# Patient Record
Sex: Male | Born: 1980 | Race: White | Hispanic: No | Marital: Single | Smoking: Current every day smoker
Health system: Southern US, Community
[De-identification: ages and names within clinical notes are randomized; demographics above are authoritative.]

## PROBLEM LIST (undated history)

## (undated) HISTORY — PX: APPENDECTOMY: SHX54

---

## 2019-11-17 ENCOUNTER — Other Ambulatory Visit: Payer: Self-pay

## 2019-11-17 ENCOUNTER — Emergency Department (HOSPITAL_BASED_OUTPATIENT_CLINIC_OR_DEPARTMENT_OTHER): Payer: 59

## 2019-11-17 ENCOUNTER — Encounter (HOSPITAL_BASED_OUTPATIENT_CLINIC_OR_DEPARTMENT_OTHER): Payer: Self-pay | Admitting: Emergency Medicine

## 2019-11-17 ENCOUNTER — Inpatient Hospital Stay (HOSPITAL_BASED_OUTPATIENT_CLINIC_OR_DEPARTMENT_OTHER)
Admission: EM | Admit: 2019-11-17 | Discharge: 2019-11-20 | DRG: 392 | Disposition: A | Discharge status: Home / Self Care | Payer: 59 | Attending: Internal Medicine | Admitting: Internal Medicine

## 2019-11-17 DIAGNOSIS — K760 Fatty (change of) liver, not elsewhere classified: Secondary | ICD-10-CM | POA: Diagnosis present

## 2019-11-17 DIAGNOSIS — K5720 Diverticulitis of large intestine with perforation and abscess without bleeding: Principal | ICD-10-CM | POA: Diagnosis present

## 2019-11-17 DIAGNOSIS — K4090 Unilateral inguinal hernia, without obstruction or gangrene, not specified as recurrent: Secondary | ICD-10-CM | POA: Diagnosis present

## 2019-11-17 DIAGNOSIS — K5700 Diverticulitis of small intestine with perforation and abscess without bleeding: Secondary | ICD-10-CM | POA: Diagnosis not present

## 2019-11-17 DIAGNOSIS — N281 Cyst of kidney, acquired: Secondary | ICD-10-CM | POA: Diagnosis present

## 2019-11-17 DIAGNOSIS — Z20822 Contact with and (suspected) exposure to covid-19: Secondary | ICD-10-CM | POA: Diagnosis present

## 2019-11-17 DIAGNOSIS — F17290 Nicotine dependence, other tobacco product, uncomplicated: Secondary | ICD-10-CM | POA: Diagnosis present

## 2019-11-17 DIAGNOSIS — Z9049 Acquired absence of other specified parts of digestive tract: Secondary | ICD-10-CM

## 2019-11-17 DIAGNOSIS — K567 Ileus, unspecified: Secondary | ICD-10-CM | POA: Diagnosis present

## 2019-11-17 DIAGNOSIS — K5780 Diverticulitis of intestine, part unspecified, with perforation and abscess without bleeding: Secondary | ICD-10-CM

## 2019-11-17 DIAGNOSIS — K862 Cyst of pancreas: Secondary | ICD-10-CM | POA: Diagnosis present

## 2019-11-17 LAB — CBC WITH DIFFERENTIAL/PLATELET
Abs Immature Granulocytes: 0.16 10*3/uL — ABNORMAL HIGH (ref 0.00–0.07)
Basophils Absolute: 0.1 10*3/uL (ref 0.0–0.1)
Basophils Relative: 1 %
Eosinophils Absolute: 0.2 10*3/uL (ref 0.0–0.5)
Eosinophils Relative: 1 %
HCT: 48.4 % (ref 39.0–52.0)
Hemoglobin: 16.7 g/dL (ref 13.0–17.0)
Immature Granulocytes: 1 %
Lymphocytes Relative: 10 %
Lymphs Abs: 2 10*3/uL (ref 0.7–4.0)
MCH: 31.7 pg (ref 26.0–34.0)
MCHC: 34.5 g/dL (ref 30.0–36.0)
MCV: 91.8 fL (ref 80.0–100.0)
Monocytes Absolute: 1.7 10*3/uL — ABNORMAL HIGH (ref 0.1–1.0)
Monocytes Relative: 9 %
Neutro Abs: 15.5 10*3/uL — ABNORMAL HIGH (ref 1.7–7.7)
Neutrophils Relative %: 78 %
Platelets: 278 10*3/uL (ref 150–400)
RBC: 5.27 MIL/uL (ref 4.22–5.81)
RDW: 12.4 % (ref 11.5–15.5)
WBC: 19.6 10*3/uL — ABNORMAL HIGH (ref 4.0–10.5)
nRBC: 0 % (ref 0.0–0.2)

## 2019-11-17 LAB — RESPIRATORY PANEL BY RT PCR (FLU A&B, COVID)
Influenza A by PCR: NEGATIVE
Influenza B by PCR: NEGATIVE
SARS Coronavirus 2 by RT PCR: NEGATIVE

## 2019-11-17 LAB — COMPREHENSIVE METABOLIC PANEL
ALT: 81 U/L — ABNORMAL HIGH (ref 0–44)
AST: 34 U/L (ref 15–41)
Albumin: 4.7 g/dL (ref 3.5–5.0)
Alkaline Phosphatase: 60 U/L (ref 38–126)
Anion gap: 12 (ref 5–15)
BUN: 14 mg/dL (ref 6–20)
CO2: 23 mmol/L (ref 22–32)
Calcium: 10 mg/dL (ref 8.9–10.3)
Chloride: 101 mmol/L (ref 98–111)
Creatinine, Ser: 1.21 mg/dL (ref 0.61–1.24)
GFR calc Af Amer: >60 mL/min (ref >60)
GFR calc non Af Amer: >60 mL/min (ref >60)
Glucose, Bld: 113 mg/dL — ABNORMAL HIGH (ref 70–99)
Potassium: 4.2 mmol/L (ref 3.5–5.1)
Sodium: 136 mmol/L (ref 135–145)
Total Bilirubin: 0.9 mg/dL (ref 0.3–1.2)
Total Protein: 8.1 g/dL (ref 6.5–8.1)

## 2019-11-17 LAB — LIPASE, BLOOD: Lipase: 30 U/L (ref 11–51)

## 2019-11-17 MED ORDER — IOHEXOL 300 MG/ML  SOLN
100.0000 mL | Freq: Once | INTRAMUSCULAR | Status: AC | PRN
  Administered 2019-11-17: 12:00:00 100 mL via INTRAVENOUS

## 2019-11-17 MED ORDER — IPRATROPIUM BROMIDE 0.02 % IN SOLN: 0.5000 mg | Freq: Four times a day (QID) | RESPIRATORY_TRACT | Status: DC | PRN

## 2019-11-17 MED ORDER — PIPERACILLIN-TAZOBACTAM 3.375 G IVPB 30 MIN
3.3750 g | Freq: Once | INTRAVENOUS | Status: AC
  Administered 2019-11-17: 3.375 g via INTRAVENOUS
  Filled 2019-11-17 (×2): qty 50

## 2019-11-17 MED ORDER — HYDROMORPHONE HCL 1 MG/ML IJ SOLN
1.0000 mg | Freq: Once | INTRAMUSCULAR | Status: AC
  Administered 2019-11-17: 1 mg via INTRAVENOUS
  Filled 2019-11-17: qty 1

## 2019-11-17 MED ORDER — SODIUM CHLORIDE 0.9 % IV SOLN
INTRAVENOUS | Status: DC | PRN
  Administered 2019-11-17: 250 mL via INTRAVENOUS

## 2019-11-17 MED ORDER — ACETAMINOPHEN 650 MG RE SUPP: 650.0000 mg | Freq: Four times a day (QID) | RECTAL | Status: DC | PRN

## 2019-11-17 MED ORDER — MORPHINE SULFATE (PF) 4 MG/ML IV SOLN
4.0000 mg | Freq: Once | INTRAVENOUS | Status: AC
  Administered 2019-11-17: 4 mg via INTRAVENOUS
  Filled 2019-11-17: qty 1

## 2019-11-17 MED ORDER — SODIUM CHLORIDE 0.9 % IV SOLN: INTRAVENOUS | Status: DC

## 2019-11-17 MED ORDER — ONDANSETRON HCL 4 MG/2ML IJ SOLN: 4.0000 mg | Freq: Four times a day (QID) | INTRAMUSCULAR | Status: DC | PRN

## 2019-11-17 MED ORDER — ACETAMINOPHEN 325 MG PO TABS: 650.0000 mg | ORAL_TABLET | Freq: Four times a day (QID) | ORAL | Status: DC | PRN

## 2019-11-17 MED ORDER — ONDANSETRON HCL 4 MG PO TABS: 4.0000 mg | ORAL_TABLET | Freq: Four times a day (QID) | ORAL | Status: DC | PRN

## 2019-11-17 MED ORDER — ONDANSETRON HCL 4 MG/2ML IJ SOLN
4.0000 mg | Freq: Once | INTRAMUSCULAR | Status: AC
  Administered 2019-11-17: 4 mg via INTRAVENOUS
  Filled 2019-11-17: qty 2

## 2019-11-17 MED ORDER — SENNOSIDES-DOCUSATE SODIUM 8.6-50 MG PO TABS: 1.0000 | ORAL_TABLET | Freq: Every evening | ORAL | Status: DC | PRN

## 2019-11-17 MED ORDER — HYDROCODONE-ACETAMINOPHEN 5-325 MG PO TABS
1.0000 | ORAL_TABLET | ORAL | Status: DC | PRN
  Administered 2019-11-17 – 2019-11-20 (×8): 2 via ORAL
  Filled 2019-11-17 (×8): qty 2

## 2019-11-17 MED ORDER — IPRATROPIUM BROMIDE 0.02 % IN SOLN: 0.5000 mg | Freq: Four times a day (QID) | RESPIRATORY_TRACT | Status: DC

## 2019-11-17 MED ORDER — PIPERACILLIN-TAZOBACTAM 3.375 G IVPB
3.3750 g | Freq: Three times a day (TID) | INTRAVENOUS | Status: DC
  Administered 2019-11-17 – 2019-11-20 (×9): 3.375 g via INTRAVENOUS
  Filled 2019-11-17 (×10): qty 50

## 2019-11-17 MED ORDER — HYDROMORPHONE HCL 1 MG/ML IJ SOLN
1.0000 mg | INTRAMUSCULAR | Status: DC | PRN
  Administered 2019-11-17 – 2019-11-18 (×6): 1 mg via INTRAVENOUS
  Filled 2019-11-17 (×6): qty 1

## 2019-11-17 MED ORDER — SODIUM CHLORIDE 0.9 % IV BOLUS
1000.0000 mL | Freq: Once | INTRAVENOUS | Status: AC
  Administered 2019-11-17: 1000 mL via INTRAVENOUS

## 2019-11-17 NOTE — Consult Note (Signed)
Re:   Justin Benjamin DOB:   1981-04-06 MRN:   [id number]  Chief Complaint Abdominal pain  ASSESEMENT AND PLAN: 1.  Sigmoid diverticulitis  Agree with plan of NPO, IVF, antibiotics, follow up PE and labs.  2.  Pancreatic cyst  Radiology suggest 1 year follow up  I gave him a copy of his CT scan report. 3.  Quit smoking about 4 months ago 4.  Possible inguinal hernias on CT scan  Chief Complaint  Patient presents with  . Abdominal Pain   PHYSICIAN REQUESTING CONSULTATION: Dr. Rod Can HISTORY OF PRESENT ILLNESS: Justin Benjamin is a 39 y.o. (DOB: [birth year])  white male whose primary care physician is Patient, No Pcp Per.   The patient went to [employer] last night for [place] food.  He developed pain after the meal.  He could not sleep.  His pain was worse this AM and he went to his parents house.  The took him to an Urgent Care who referred him to the [facility] ER.  He has no prior history of diverticular or colon disease.  He thinks that he had a colonoscopy over 10 years ago, but is unsure of this.  He had an appendectomy at age 40.  CT scan of the abdomen - 11/17/2019 - 1. Sigmoid diverticulitis. Several adjacent pockets of low attenuation within the wall of the sigmoid colon in the region of adjacent stranding is concerning for a developing abscess within the wall of the sigmoid colon. The maximum dimension is 3.5 cm as seen on coronal image 44. No extraluminal gas or pericolonic abscess.  2. A few loops of mildly dilated small bowel are seen in the pelvis. Focal ileus is favored over obstruction.   3. 8 mm cyst in the pancreatic tail. Recommend a follow-up CT scan in 1 year.  4. Hepatic steatosis.  5. Left renal cysts.  6. Fat containing inguinal hernia.  WBC - 19,600 - 11/17/2019   History reviewed. No pertinent past medical history.    Past Surgical History:  Procedure Laterality Date  . APPENDECTOMY        Current Facility-Administered Medications    Medication Dose Route Frequency Provider Last Rate Last Admin  . 0.9 %  sodium chloride infusion   Intravenous Continuous Rai, Ripudeep K, MD      . acetaminophen (TYLENOL) tablet 650 mg  650 mg Oral Q6H PRN Rai, Ripudeep K, MD       Or  . acetaminophen (TYLENOL) suppository 650 mg  650 mg Rectal Q6H PRN Rai, Ripudeep K, MD      . HYDROcodone-acetaminophen (NORCO/VICODIN) 5-325 MG per tablet 1-2 tablet  1-2 tablet Oral Q4H PRN Rai, Ripudeep K, MD      . HYDROmorphone (DILAUDID) injection 1 mg  1 mg Intravenous Q3H PRN Rai, Ripudeep K, MD   1 mg at 11/17/19 1739  . ipratropium (ATROVENT) nebulizer solution 0.5 mg  0.5 mg Nebulization Q6H Rai, Ripudeep K, MD      . ondansetron (ZOFRAN) tablet 4 mg  4 mg Oral Q6H PRN Rai, Ripudeep K, MD       Or  . ondansetron (ZOFRAN) injection 4 mg  4 mg Intravenous Q6H PRN Rai, Ripudeep K, MD      . piperacillin-tazobactam (ZOSYN) IVPB 3.375 g  3.375 g Intravenous Q8H Rai, Ripudeep K, MD      . senna-docusate (Senokot-S) tablet 1 tablet  1 tablet Oral QHS PRN Rai, Delene Ruffini, MD  No Known Allergies  REVIEW OF SYSTEMS: Skin:  No history of rash.  No history of abnormal moles. Infection:  No history of hepatitis or HIV.  No history of MRSA. Neurologic:  No history of stroke.  No history of seizure.  No history of headaches. Cardiac:  No history of hypertension. No history of heart disease.  No history of prior cardiac catheterization.  No history of seeing a cardiologist. Pulmonary:  Quit smoking 4 months ago.  Endocrine:  No diabetes. No thyroid disease. Gastrointestinal:  See HPI Urologic:  No history of kidney stones.  No history of bladder infections. Musculoskeletal:  No history of joint or back disease. Hematologic:  No bleeding disorder.  No history of anemia.  Not anticoagulated. Psycho-social:  The patient is oriented.   The patient has no obvious psychologic or social impairment to understanding our conversation and plan.  SOCIAL and  FAMILY HISTORY: Unmarried.  Has 91 yo daughter.  Works as an Clinical biochemist for [employer]  The patient has no family history of diverticular disease.  His father died of metastatic testicular cancer.  PHYSICAL EXAM: BP 130/88 (BP Location: Right Arm)   Pulse 90   Temp 98.3 F (36.8 C) (Oral)   Resp 18   Ht 5\' 11"  (1.803 m)   Wt 102 kg   SpO2 97%   BMI 31.36 kg/m   General: Heave WM who is alert and generally healthy appearing.  Skin:  Inspection and palpation - no mass or rash. Eyes:  Conjunctiva and lids unremarkable.            Pupils are equal Ears, Nose, Mouth, and Throat:  Ears and nose unremarkable            Lips and teeth are unremarable. Neck: Supple. No mass, trachea midline.  No thyroid mass. Lymph Nodes:  No supraclavicular, cervical, or inguinal nodes. Lungs: Normal respiratory effort.  Clear to auscultation and symmetric breath sounds. Heart:  Palpation of the heart is normal.            Auscultation: RRR. No murmur or rub.  Abdomen: Mildly distended.  Rare BS.  Tender LLQ with mild guarding. Rectal: Not done. Musculoskeletal:  Good muscle strength and ROM  in upper and lower extremities.  Neurologic:  Grossly intact to motor and sensory function. Psychiatric: Normal judgement and insight. Behavior is normal.            Oriented to time, person, place.   DATA REVIEWED, COUNSELING AND COORDINATION OF CARE: Epic notes reviewed. I have personally seen and evaluated the patient, evaluated laboratory and imaging results, formulated the assessment and plan and placed orders. This requires high medical decision making. Total time spent with patient and charting: 50 minutes This is a consultation.  Alphonsa Overall, MD,  [facility], [address]  [place], [place]    [place] Phone:  [phone] FAX:  [phone]

## 2019-11-17 NOTE — ED Notes (Addendum)
[facility] @ [phone] to have Hospitalist called [place] . Spoke to [employer]

## 2019-11-17 NOTE — ED Notes (Signed)
ED Provider at bedside. 

## 2019-11-17 NOTE — ED Triage Notes (Signed)
LLQ pain since 8pm. Denies N/V/D, urinary symptoms.

## 2019-11-17 NOTE — ED Notes (Signed)
Surgical consult called via [facility]link. Spoke with [place].

## 2019-11-17 NOTE — ED Provider Notes (Signed)
[facility] EMERGENCY DEPARTMENT Provider Note   CSN: [id number] Arrival date & time: 11/17/19  1050     History Chief Complaint  Patient presents with  . Abdominal Pain    Justin Benjamin is a 39 y.o. male with a history of tobacco abuse and prior appendectomy who presents to the ED with complaints of abdominal pain that began last night.  Patient states pain is located to the left lower quadrant, it is constant, progressively worsening, sharp in nature, currently a 9 out of 10 in severity.  No alleviating or aggravating factors.  Reports associated chills.  Last bowel movement was last night, had a lot of pain with this, passed a small amount of stool.  Denies fever, nausea, vomiting, melena, hematochezia, diarrhea, dysuria, or testicular pain/swelling.  HPI     History reviewed. No pertinent past medical history.  There are no problems to display for this patient.   Past Surgical History:  Procedure Laterality Date  . APPENDECTOMY         History reviewed. No pertinent family history.  Social History   Tobacco Use  . Smoking status: Current Every Day Smoker    Types: E-cigarettes  . Smokeless tobacco: Never Used  Substance Use Topics  . Alcohol use: Yes  . Drug use: Not on file    Home Medications Prior to Admission medications   Not on File    Allergies    Patient has no known allergies.  Review of Systems   Review of Systems  Constitutional: Positive for chills. Negative for fever.  Respiratory: Negative for shortness of breath.   Cardiovascular: Negative for chest pain and leg swelling.  Gastrointestinal: Positive for abdominal pain and constipation. Negative for anal bleeding, blood in stool, diarrhea, nausea and vomiting.  Genitourinary: Negative for dysuria, penile swelling and scrotal swelling.  All other systems reviewed and are negative.   Physical Exam Updated Vital Signs BP 135/84 (BP Location: Right Arm)   Pulse 92   Temp 97.8  F (36.6 C) (Oral)   Resp 18   Ht 5\' 11"  (1.803 m)   Wt 102.5 kg   SpO2 95%   BMI 31.52 kg/m   Physical Exam Vitals and nursing note reviewed.  Constitutional:      General: He is in acute distress (appears uncomfortable).     Appearance: He is well-developed. He is not toxic-appearing.  HENT:     Head: Normocephalic and atraumatic.  Eyes:     General:        Right eye: No discharge.        Left eye: No discharge.     Conjunctiva/sclera: Conjunctivae normal.  Cardiovascular:     Rate and Rhythm: Normal rate and regular rhythm.  Pulmonary:     Effort: Pulmonary effort is normal. No respiratory distress.     Breath sounds: Normal breath sounds. No wheezing, rhonchi or rales.  Abdominal:     General: There is no distension.     Palpations: Abdomen is soft.     Tenderness: There is abdominal tenderness in the left lower quadrant. There is guarding (mild).  Musculoskeletal:     Cervical back: Neck supple.  Skin:    General: Skin is warm and dry.     Findings: No rash.  Neurological:     Mental Status: He is alert.     Comments: Clear speech.   Psychiatric:        Behavior: Behavior normal.     ED Results /  Procedures / Treatments   Labs (all labs ordered are listed, but only abnormal results are displayed) Labs Reviewed  CBC WITH DIFFERENTIAL/PLATELET - Abnormal; Notable for the following components:      Result Value   WBC 19.6 (*)    Neutro Abs 15.5 (*)    Monocytes Absolute 1.7 (*)    Abs Immature Granulocytes 0.16 (*)    All other components within normal limits  COMPREHENSIVE METABOLIC PANEL - Abnormal; Notable for the following components:   Glucose, Bld 113 (*)    ALT 81 (*)    All other components within normal limits  LIPASE, BLOOD  URINALYSIS, ROUTINE W REFLEX MICROSCOPIC    EKG None  Radiology CT Abdomen Pelvis W Contrast  Result Date: 11/17/2019 CLINICAL DATA:  Abdominal pain. EXAM: CT ABDOMEN AND PELVIS WITH CONTRAST TECHNIQUE: Multidetector  CT imaging of the abdomen and pelvis was performed using the standard protocol following bolus administration of intravenous contrast. CONTRAST:  128mL OMNIPAQUE IOHEXOL 300 MG/ML  SOLN COMPARISON:  None. FINDINGS: Lower chest: Platelike atelectasis in the lingula. The lower chest and lower lungs are otherwise normal. Hepatobiliary: Hepatic steatosis. The liver, gallbladder, and portal vein are otherwise normal. Pancreas: There is a small probable cyst in the pancreatic tail measuring 8 mm as seen on coronal image 66 and sagittal image 27. The pancreas is otherwise normal. Spleen: Normal in size without focal abnormality. Adrenals/Urinary Tract: Adrenal glands are normal. There is a cyst in the left kidney best seen on coronal image 73. The kidneys, ureters, and bladder are otherwise normal. Stomach/Bowel: The stomach is normal. There are a few prominent/mildly dilated loops of small bowel in the pelvis such as on series 2, image 67. A representative small bowel loop measures just greater than 3 cm in caliber. No discrete transition point is identified. No wall thickening. The remainder of the small bowel is normal. Scattered colonic diverticuli are identified. There is significant stranding adjacent to the sigmoid colon. There are diverticuli in this region. There is low-attenuation in the sigmoid wall in this region as seen on coronal image 44. The low-attenuation appears to contain more than 1 pocket with a total maximum dimension of 3.5 cm. No definite extraluminal gas. No pericolonic abscess identified. The remainder of the colon is unremarkable. Previous appendectomy. Vascular/Lymphatic: No significant vascular findings are present. No enlarged abdominal or pelvic lymph nodes. Reproductive: Prostate is unremarkable. Other: Fat containing right inguinal hernia.  No free air. Musculoskeletal: No acute or significant osseous findings. IMPRESSION: 1. Sigmoid diverticulitis. Several adjacent pockets of low  attenuation within the wall of the sigmoid colon in the region of adjacent stranding is concerning for a developing abscess within the wall of the sigmoid colon. The maximum dimension is 3.5 cm as seen on coronal image 44. No extraluminal gas or pericolonic abscess. 2. A few loops of mildly dilated small bowel are seen in the pelvis. Focal ileus is favored over obstruction. Recommend attention on follow-up and clinical correlation. 3. 8 mm cyst in the pancreatic tail. Recommend a follow-up CT scan in 1 year. 4. Hepatic steatosis. 5. Left renal cysts. 6. Fat containing inguinal hernia. Electronically Signed   By: Dorise Bullion III M.D   On: 11/17/2019 12:40    Procedures Procedures (including critical care time)  Medications Ordered in ED Medications  sodium chloride 0.9 % bolus 1,000 mL (1,000 mLs Intravenous New Bag/Given 11/17/19 1134)  morphine 4 MG/ML injection 4 mg (4 mg Intravenous Given 11/17/19 1127)  ondansetron (ZOFRAN)  injection 4 mg (4 mg Intravenous Given 11/17/19 1128)  iohexol (OMNIPAQUE) 300 MG/ML solution 100 mL (100 mLs Intravenous Contrast Given 11/17/19 1146)    ED Course  I have reviewed the triage vital signs and the nursing notes.  Pertinent labs & imaging results that were available during my care of the patient were reviewed by me and considered in my medical decision making (see chart for details).  Justin Benjamin was evaluated in Emergency Department on 11/17/2019 for the symptoms described in the history of present illness. He/she was evaluated in the context of the global COVID-19 pandemic, which necessitated consideration that the patient might be at risk for infection with the SARS-CoV-2 virus that causes COVID-19. Institutional protocols and algorithms that pertain to the evaluation of patients at risk for COVID-19 are in a state of rapid change based on information released by regulatory bodies including the CDC and federal and state organizations. These policies and  algorithms were followed during the patient's care in the ED.    MDM Rules/Calculators/A&P                     Patient presents to the ED with complaints of abdominal pain. Nontoxic, appears uncomfortably, tachycardia/tachypnea resolved on my assessment. Focal LLQ abdominal tenderness with a degree of guarding noted. DDx: Diverticulitis, perforation, obstruction, intra-abdominal abscess, nephrolithiasis, pancreatitis, pyelonephritis, constipation.  Additional history obtained:  Previous records obtained and reviewed and nursing noted reviewed for additional history.   Lab Tests:  I Ordered, reviewed, and interpreted labs, which included: CBC: Leukocytosis at 19.6 with left shift.  No anemia. CMP: Mildly elevated ALT-no prior on record for comparison.  T bili WNL.  No significant electrolyte derangement. Lipase: WNL Urinalysis: Pending  Imaging Studies ordered:  I ordered imaging studies which included CT A/P, I independently visualized and interpreted imaging which showed sigmoid diverticulitis. Several adjacent pockets of low attenuation within the wall of the sigmoid colon in the region of adjacent stranding is concerning for a developing abscess within the wall of the sigmoid colon. The maximum dimension is 3.5 cm. No extraluminal gas or pericolonic abscess. A few loops of mildly dilated small bowel are seen in the pelvis. Focal ileus is favored over obstruction. Recommend attention on follow-up and clinical correlation. Additional findings as above.   Medicines ordered/Re-evaluations/Consultations  I ordered medication morphine, zofran, & fluids for symptomatic management on initial assessment.   13:00: RE-EVAL: Patient had minimal temporary relief with morphine, appears very uncomfortable on re-assessment, dilaudid ordered. Zosyn started for complicated diverticulitis. Will consult general surgery. Patient updated on results & plan of care at this time.    13:30: RE-EVAL: Pain 7/10 in  severity, additional analgesics ordered.   13:40: CONSULT: Discussed with general surgeon Dr. Ezzard Standing- plan for medical admission, will see in consultation.   13:52: CONSULT: Discussed case with hospitalist Dr. Hanley Ben, he states there are currently no beds at [facility] and [facility] is not accepting transfers, therefore he recommends seeking placement at alternative hospital facility, will place bed request in the interim.   14:26: CONSULT: Discussed case with hospitalist Dr. Laural Benes of [facility] with [facility]- currently no beds available, potential for ED to ED transfer, but he would not be able to accept the patient in this fashion.   14:32: CONSULT: Discussed with [facility] Transfer line- will page out to physician.   14:45: Patient has bed assignment at [facility], rediscussed with hospitalist service via messaging, will discontinue search for alternative placement at  this time.  Portions of this note were generated with Scientist, clinical (histocompatibility and immunogenetics). Dictation errors may occur despite best attempts at proofreading.  Findings and plan of care discussed with supervising physician Dr. Dalene Seltzer who is in agreement.    Final Clinical Impression(s) / ED Diagnoses Final diagnoses:  Diverticulitis of large intestine with abscess, unspecified bleeding status    Rx / DC Orders ED Discharge Orders    None       Cherly Anderson, PA-C 11/17/19 1657    Alvira Monday, MD 11/17/19 2316

## 2019-11-17 NOTE — Progress Notes (Signed)
Patient ID: Justin Benjamin, male   DOB: [birth year], 39 y.o.   MRN: [id number] I was called by med [facility] about this patient needing admission for acute sigmoid diverticulitis with abscess.  General surgery has been called who requested hospitalist admission.  As per [facility], currently no MedSurg beds at [facility] or [facility].  If MedSurg beds open up, we will accept the patient for admission to MedSurg at either of the hospitals.  In the meantime, I have advised Lelon Mast to look for other facilities for this patient as his condition might worsen and might need emergent IR guided drain placement or surgical intervention.

## 2019-11-17 NOTE — ED Notes (Signed)
Pt unable to provide urine sample at this time 

## 2019-11-17 NOTE — H&P (Signed)
History and Physical        Hospital Admission Note Date: 11/17/2019  Patient name: Justin Benjamin Medical record number: [id number] Date of birth: 1981/06/07 Age: 39 y.o. Gender: male  PCP: Patient, No Pcp Per    Patient coming from: home   I have reviewed all records in the [facility].    Chief Complaint:  Severe abdominal pain since yesterday evening  HPI: Patient is a 39 year old male with no significant past medical history presented from [place] with severe left lower quadrant abdominal pain that started at 8 PM on 4/24, day before the admission.  Patient reported that he went to [place] for dinner, came home around 7 PM.  About an hour later, patient started having significant left lower quadrant pain which progressively got worse, 10 out of 10 in intensity sharp and constant, no radiation.  Denied any nausea vomiting or diarrhea.  Felt chills but no fevers.  Had no prior history of diverticulitis or similar pain in the past.  At the time of my examination, abdominal pain 7-8/10 in intensity.  Last BM yesterday.   ED work-up/course:  In ED, temp 97.8, respiratory rate 18 pulse rate 93, BP 135/83, O2 sats at 97% on room air   CT abdomen showed sigmoid diverticulitis, concern for developing abscess maximum dimension 3.5 cm, in the wall of the sigmoid colon, ileus Sodium 136, potassium 4.2, BUN 14, creatinine 1.2 WBCs 19.6, hemoglobin 16.7 platelets 278  General surgery was consulted, recommended hospital service to admit.   Review of Systems: Positives marked in 'bold' Constitutional: Denies fever,diaphoresis, poor appetite and fatigue, + chills.  HEENT: Denies photophobia, eye pain, redness, hearing loss, ear pain, congestion, sore throat, rhinorrhea, sneezing, mouth sores, trouble swallowing, neck pain, neck stiffness and  tinnitus.   Respiratory: Denies SOB, DOE, cough, chest tightness,  and wheezing.   Cardiovascular: Denies chest pain, palpitations and leg swelling.  Gastrointestinal: See HPI Genitourinary: Denies dysuria, urgency, frequency, hematuria, flank pain and difficulty urinating.  Musculoskeletal: Denies myalgias, back pain, joint swelling, arthralgias and gait problem.  Skin: Denies pallor, rash and wound.  Neurological: Denies dizziness, seizures, syncope, weakness, light-headedness, numbness and headaches.  Hematological: Denies adenopathy. Easy bruising, personal or family bleeding history  Psychiatric/Behavioral: Denies suicidal ideation, mood changes, confusion, nervousness, sleep disturbance and agitation  Past Medical History: Patient reports no medical health issues  Past Surgical History:  Procedure Laterality Date  . APPENDECTOMY    At the age of 50  Medications: Prior to Admission medications   Medication Sig Start Date End Date Taking? Authorizing Provider  bisacodyl (DULCOLAX) 5 MG EC tablet Take 5 mg by mouth daily as needed for moderate constipation.   Yes [provider]    Allergies:  No Known Allergies  Social History: Quit smoking, about 5 months ago.  Drinks alcohol occasionally.  Denies any recreational drug use.    Family History: Patient reports that he is adopted and hence does not know his family medical history  Physical Exam: Blood pressure 130/88, pulse 90, temperature 98.3 F (36.8 C), temperature source Oral, resp. rate 18, height 5\' 11"  (1.803 m), weight 102 kg,  SpO2 97 %. General: Alert, awake, oriented x3, in no acute distress.  Uncomfortable due to abdominal pain Eyes: pink conjunctiva,anicteric sclera, pupils equal and reactive to light and accomodation, HEENT: normocephalic, atraumatic, oropharynx clear Neck: supple, no masses or lymphadenopathy, no goiter, no bruits, no JVD CVS: Regular rate and rhythm, without murmurs, rubs or  gallops. No lower extremity edema Resp : Clear to auscultation bilaterally, no wheezing, rales or rhonchi. GI : Soft, tenderness in the left lower quadrant, voluntary guarding, ND, NBS  Musculoskeletal: No clubbing or cyanosis, positive pedal pulses. No contracture. ROM intact  Neuro: Grossly intact, no focal neurological deficits, strength 5/5 upper and lower extremities bilaterally Psych: alert and oriented x 3, normal mood and affect Skin: no rashes or lesions, warm and dry   LABS on Admission: I have personally reviewed all the labs and imagings below    Basic Metabolic Panel: Recent Labs  Lab 11/17/19 1110  NA 136  K 4.2  CL 101  CO2 23  GLUCOSE 113*  BUN 14  CREATININE 1.21  CALCIUM 10.0   Liver Function Tests: Recent Labs  Lab 11/17/19 1110  AST 34  ALT 81*  ALKPHOS 60  BILITOT 0.9  PROT 8.1  ALBUMIN 4.7   Recent Labs  Lab 11/17/19 1110  LIPASE 30   No results for input(s): AMMONIA in the last 168 hours. CBC: Recent Labs  Lab 11/17/19 1110  WBC 19.6*  NEUTROABS 15.5*  HGB 16.7  HCT 48.4  MCV 91.8  PLT 278   Cardiac Enzymes: No results for input(s): CKTOTAL, CKMB, CKMBINDEX, TROPONINI in the last 168 hours. BNP: Invalid input(s): POCBNP CBG: No results for input(s): GLUCAP in the last 168 hours.  Radiological Exams on Admission:  CT Abdomen Pelvis W Contrast  Result Date: 11/17/2019 CLINICAL DATA:  Abdominal pain. EXAM: CT ABDOMEN AND PELVIS WITH CONTRAST TECHNIQUE: Multidetector CT imaging of the abdomen and pelvis was performed using the standard protocol following bolus administration of intravenous contrast. CONTRAST:  OMNIPAQUE IOHEXOL 300 MG/ML  SOLN COMPARISON:  None. FINDINGS: Lower chest: Platelike atelectasis in the lingula. The lower chest and lower lungs are otherwise normal. Hepatobiliary: Hepatic steatosis. The liver, gallbladder, and portal vein are otherwise normal. Pancreas: There is a small probable cyst in the pancreatic  tail measuring 8 mm as seen on coronal image 66 and sagittal image 27. The pancreas is otherwise normal. Spleen: Normal in size without focal abnormality. Adrenals/Urinary Tract: Adrenal glands are normal. There is a cyst in the left kidney best seen on coronal image 73. The kidneys, ureters, and bladder are otherwise normal. Stomach/Bowel: The stomach is normal. There are a few prominent/mildly dilated loops of small bowel in the pelvis such as on series 2, image 67. A representative small bowel loop measures just greater than 3 cm in caliber. No discrete transition point is identified. No wall thickening. The remainder of the small bowel is normal. Scattered colonic diverticuli are identified. There is significant stranding adjacent to the sigmoid colon. There are diverticuli in this region. There is low-attenuation in the sigmoid wall in this region as seen on coronal image 44. The low-attenuation appears to contain more than 1 pocket with a total maximum dimension of 3.5 cm. No definite extraluminal gas. No pericolonic abscess identified. The remainder of the colon is unremarkable. Previous appendectomy. Vascular/Lymphatic: No significant vascular findings are present. No enlarged abdominal or pelvic lymph nodes. Reproductive: Prostate is unremarkable. Other: Fat containing right inguinal hernia.  No free air. Musculoskeletal:  No acute or significant osseous findings. IMPRESSION: 1. Sigmoid diverticulitis. Several adjacent pockets of low attenuation within the wall of the sigmoid colon in the region of adjacent stranding is concerning for a developing abscess within the wall of the sigmoid colon. The maximum dimension is 3.5 cm as seen on coronal image 44. No extraluminal gas or pericolonic abscess. 2. A few loops of mildly dilated small bowel are seen in the pelvis. Focal ileus is favored over obstruction. Recommend attention on follow-up and clinical correlation. 3. 8 mm cyst in the pancreatic tail. Recommend  a follow-up CT scan in 1 year. 4. Hepatic steatosis. 5. Left renal cysts. 6. Fat containing inguinal hernia. Electronically Signed   By: Gerome Sam III M.D   On: 11/17/2019 12:40      EKG: No EKG available   Assessment/Plan Principal Problem: Acute abdominal pain, diverticulitis of large intestine with abscess -First episode.  Denies any prior history of diverticulitis.  Lipase normal -Placed on n.p.o. status, IV fluids, pain control, IV Zosyn -General surgery consulted, discussed with Dr. Ezzard Standing.  I also consulted interventional radiology if patient needs any drain for the fluid collection.  Leukocytosis - Likely due to #1, obtain blood cultures, continue IV Zosyn   DVT prophylaxis: SCDs  CODE STATUS: Full code, discussed with the patient  Consults called: General surgery, IR  Family Communication: Admission, patients condition and plan of care including tests being ordered have been discussed with the patient who indicates understanding and agree with the plan and Code Status  Admission status: Inpatient MedSurg  The medical decision making on this patient was of high complexity and the patient is at high risk for clinical deterioration, therefore this is a level 3 admission.  Severity of Illness:      The appropriate patient status for this patient is INPATIENT. Inpatient status is judged to be reasonable and necessary in order to provide the required intensity of service to ensure the patient's safety. The patient's presenting symptoms, physical exam findings, and initial radiographic and laboratory data in the context of their chronic comorbidities is felt to place them at high risk for further clinical deterioration. Furthermore, it is not anticipated that the patient will be medically stable for discharge from the hospital within 2 midnights of admission. The following factors support the patient status of inpatient.   " The patient's presenting symptoms include acute  left-sided abdominal pain, diverticulitis with abscess " The worrisome physical exam findings include worsening abdominal pain, chills " The initial radiographic and laboratory data are worrisome because of CT abdomen with sigmoid diverticulitis with developing abscess " The chronic co-morbidities include none   * I certify that at the point of admission it is my clinical judgment that the patient will require inpatient hospital care spanning beyond 2 midnights from the point of admission due to high intensity of service, high risk for further deterioration and high frequency of surveillance required.*    Time Spent on Admission: 60 minutes     Justin Benjamin M.D. [facility] 11/17/2019, 5:28 PM

## 2019-11-17 NOTE — Plan of Care (Signed)
Plan of care discussed with patient 

## 2019-11-17 NOTE — ED Notes (Signed)
Called [facility] spoke to [place] @ [phone] for pt transfer

## 2019-11-18 LAB — CBC
HCT: 45.6 % (ref 39.0–52.0)
Hemoglobin: 14.6 g/dL (ref 13.0–17.0)
MCH: 31.3 pg (ref 26.0–34.0)
MCHC: 32 g/dL (ref 30.0–36.0)
MCV: 97.6 fL (ref 80.0–100.0)
Platelets: 218 10*3/uL (ref 150–400)
RBC: 4.67 MIL/uL (ref 4.22–5.81)
RDW: 12.4 % (ref 11.5–15.5)
WBC: 16.7 10*3/uL — ABNORMAL HIGH (ref 4.0–10.5)
nRBC: 0 % (ref 0.0–0.2)

## 2019-11-18 LAB — BASIC METABOLIC PANEL
Anion gap: 8 (ref 5–15)
BUN: 14 mg/dL (ref 6–20)
CO2: 26 mmol/L (ref 22–32)
Calcium: 8.8 mg/dL — ABNORMAL LOW (ref 8.9–10.3)
Chloride: 103 mmol/L (ref 98–111)
Creatinine, Ser: 1.2 mg/dL (ref 0.61–1.24)
GFR calc Af Amer: >60 mL/min (ref >60)
GFR calc non Af Amer: >60 mL/min (ref >60)
Glucose, Bld: 110 mg/dL — ABNORMAL HIGH (ref 70–99)
Potassium: 4.3 mmol/L (ref 3.5–5.1)
Sodium: 137 mmol/L (ref 135–145)

## 2019-11-18 LAB — MRSA PCR SCREENING: MRSA by PCR: NEGATIVE

## 2019-11-18 LAB — HIV ANTIBODY (ROUTINE TESTING W REFLEX): HIV Screen 4th Generation wRfx: NONREACTIVE

## 2019-11-18 MED ORDER — HEPARIN SODIUM (PORCINE) 5000 UNIT/ML IJ SOLN
5000.0000 [IU] | Freq: Three times a day (TID) | INTRAMUSCULAR | Status: DC
  Administered 2019-11-18 – 2019-11-20 (×7): 5000 [IU] via SUBCUTANEOUS
  Filled 2019-11-18 (×8): qty 1

## 2019-11-18 NOTE — Progress Notes (Signed)
[facility]                                                                              Patient Demographics  Justin Benjamin, is a 39 y.o. male, DOB - Dec 09, 1980, BDZ:[id number]  Admit date - 11/17/2019   Admitting Physician Aline August, MD  Outpatient Primary MD for the patient is Patient, No Pcp Per  Outpatient specialists:   LOS - 1  days   Medical records reviewed and are as summarized below:    Chief Complaint  Patient presents with  . Abdominal Pain       Brief summary   Patient is a 39 year old male with no significant past medical history presented from [place] with severe left lower quadrant abdominal pain that started at 8 PM on 4/24, day before the admission.  Patient reported that he went to [place] for dinner, came home around 7 PM.  About an hour later, patient started having significant left lower quadrant pain which progressively got worse, 10 out of 10 in intensity sharp and constant, no radiation.  Denied any nausea vomiting or diarrhea.  Felt chills but no fevers.  Had no prior history of diverticulitis or similar pain in the past.   CT abdomen showed sigmoid diverticulitis, concern for developing abscess maximum dimension 3.5 cm  Assessment & Plan    Principal Problem:  Acute abdominal pain, diverticulitis of large intestine with developing abscess -First episode.  Denies any prior history of diverticulitis.  Lipase normal -Continue IV fluids, pain control, IV Zosyn -Per interventional radiology, abscesses in the wall of colon, no drainable collection -General surgery consulted -Placed on sips of clears, will place back on n.p.o. if worsening abdominal pain  Leukocytosis -WBC count improving, blood cultures negative so far, continue IV Zosyn  Code Status: Full code DVT Prophylaxis: Subcu heparin Family Communication: Discussed all imaging results, lab results, explained to the patient     Disposition Plan:     Status is: Inpatient  Remains inpatient appropriate because:IV treatments appropriate due to intensity of illness or inability to take PO   Dispo: The patient is from: Home              Anticipated d/c is to: Home              Anticipated d/c date is: 3 days              Patient currently is not medically stable to d/c.       Time Spent in minutes   35 minutes  Procedures:  None  Consultants:   General surgery IR  Antimicrobials:   Anti-infectives (From admission, onward)   Start     Dose/Rate Route Frequency Ordered Stop   11/17/19 1800  piperacillin-tazobactam (ZOSYN) IVPB 3.375 g     3.375 g 12.5 mL/hr over 240 Minutes Intravenous Every 8 hours 11/17/19 1310     11/17/19 1315  piperacillin-tazobactam (ZOSYN) IVPB 3.375 g     3.375 g 100 mL/hr over 30 Minutes Intravenous  Once 11/17/19 1310 11/17/19 1350          Medications  Scheduled Meds: Continuous Infusions: . sodium chloride 125 mL/hr at 11/18/19 1023  . sodium chloride 250 mL (11/17/19 1830)  . piperacillin-tazobactam (ZOSYN)  IV 3.375 g (11/18/19 0905)   PRN Meds:.sodium chloride, acetaminophen **OR** acetaminophen, HYDROcodone-acetaminophen, HYDROmorphone (DILAUDID) injection, ipratropium, ondansetron **OR** ondansetron (ZOFRAN) IV, senna-docusate      Subjective:   Kullen Tomasetti was seen and examined today.  Still has pain in the left lower quadrant 6/10, no nausea or vomiting, no fever chills. Patient denies dizziness, chest pain, shortness of breath, new weakness, numbess, tingling. No acute events overnight.    Objective:   Vitals:   11/17/19 1650 11/17/19 2115 11/18/19 0134 11/18/19 0554  BP:  131/88 124/83 110/85  Pulse:  100 87 92  Resp:  16 16 16   Temp:  98.3 F (36.8 C) 98.1 F (36.7 C) 97.7 F (36.5 C)  TempSrc:  Oral Oral Oral  SpO2:  94% 91% 92%  Weight: 102 kg     Height: 5\' 11"  (1.803 m)       Intake/Output Summary (Last 24 hours) at  11/18/2019 1150 Last data filed at 11/18/2019 0915 Gross per 24 hour  Intake 3221.08 ml  Output 0 ml  Net 3221.08 ml     Wt Readings from Last 3 Encounters:  11/17/19 102 kg     Exam  General: Alert and oriented x 3, NAD  Cardiovascular: S1 S2 auscultated, no murmurs, RRR  Respiratory: Clear to auscultation bilaterally, no wheezing, rales or rhonchi  Gastrointestinal: Soft, lower abdomen, left lower quadrant TTP nondistended, + bowel sounds  Ext: no pedal edema bilaterally  Neuro no new deficits  Musculoskeletal: No digital cyanosis, clubbing  Skin: No rashes  Psych: Normal affect and demeanor, alert and oriented x3    Data Reviewed:  I have personally reviewed following labs and imaging studies  Micro Results Recent Results (from the past 240 hour(s))  Respiratory Panel by RT PCR (Flu A&B, Covid) - Nasopharyngeal Swab     Status: None   Collection Time: 11/17/19  1:26 PM   Specimen: Nasopharyngeal Swab  Result Value Ref Range Status   SARS Coronavirus 2 by RT PCR NEGATIVE NEGATIVE Final    Comment: (NOTE) SARS-CoV-2 target nucleic acids are NOT DETECTED. The SARS-CoV-2 RNA is generally detectable in upper respiratoy specimens during the acute phase of infection. The lowest concentration of SARS-CoV-2 viral copies this assay can detect is 131 copies/mL. A negative result does not preclude SARS-Cov-2 infection and should not be used as the sole basis for treatment or other patient management decisions. A negative result may occur with  improper specimen collection/handling, submission of specimen other than nasopharyngeal swab, presence of viral mutation(s) within the areas targeted by this assay, and inadequate number of viral copies (<131 copies/mL). A negative result must be combined with clinical observations, patient history, and epidemiological information. The expected result is Negative. Fact Sheet for Patients:   11/19/19 Fact Sheet for Healthcare Providers:  11/19/19 This test is not yet ap proved or cleared by the https://www.moore.com/ FDA and  has been authorized for detection and/or diagnosis of SARS-CoV-2 by FDA under an Emergency Use Authorization (EUA). This EUA will remain  in effect (meaning this test can be used) for the duration of the COVID-19 declaration under Section 564(b)(1) of the Act, 21 U.S.C. section 360bbb-3(b)(1), unless the authorization is terminated or revoked sooner.    Influenza A by PCR NEGATIVE NEGATIVE Final   Influenza B by PCR NEGATIVE NEGATIVE Final  Comment: (NOTE) The Xpert Xpress SARS-CoV-2/FLU/RSV assay is intended as an aid in  the diagnosis of influenza from Nasopharyngeal swab specimens and  should not be used as a sole basis for treatment. Nasal washings and  aspirates are unacceptable for Xpert Xpress SARS-CoV-2/FLU/RSV  testing. Fact Sheet for Patients: https://www.moore.com/ Fact Sheet for Healthcare Providers: https://www.young.biz/ This test is not yet approved or cleared by the [place] FDA and  has been authorized for detection and/or diagnosis of SARS-CoV-2 by  FDA under an Emergency Use Authorization (EUA). This EUA will remain  in effect (meaning this test can be used) for the duration of the  Covid-19 declaration under Section 564(b)(1) of the Act, 21  U.S.C. section 360bbb-3(b)(1), unless the authorization is  terminated or revoked. Performed at [facility], [address] [place], [place] [address]   Culture, blood (Routine X 2) w Reflex to ID Panel     Status: None (Preliminary result)   Collection Time: 11/17/19  6:54 PM   Specimen: Right Antecubital; Blood  Result Value Ref Range Status   Specimen Description   Final    RIGHT ANTECUBITAL Performed at [facility], [address]  [place], [place] [address]    Special Requests   Final    BOTTLES DRAWN AEROBIC ONLY Blood Culture adequate volume Performed at [facility], [address], [place], [place] [address]    Culture   Final    NO GROWTH < 12 HOURS Performed at [facility], [address], [place], [place] [address]    Report Status PENDING  Incomplete  Culture, blood (Routine X 2) w Reflex to ID Panel     Status: None (Preliminary result)   Collection Time: 11/17/19  6:54 PM   Specimen: BLOOD LEFT HAND  Result Value Ref Range Status   Specimen Description   Final    BLOOD LEFT HAND Performed at [facility], [address], [place], [place] [address]    Special Requests   Final    BOTTLES DRAWN AEROBIC ONLY Blood Culture adequate volume Performed at [facility], [address] [place], [place] [address]    Culture   Final    NO GROWTH < 12 HOURS Performed at [facility], [address] [place], [place] [address]    Report Status PENDING  Incomplete  MRSA PCR Screening     Status: None   Collection Time: 11/18/19  5:00 AM   Specimen: Nasal Mucosa; Nasopharyngeal  Result Value Ref Range Status   MRSA by PCR NEGATIVE NEGATIVE Final    Comment: Performed at [facility], [address], [place], [place] [address]    Radiology Reports CT Abdomen Pelvis W Contrast  Result Date: 11/17/2019 CLINICAL DATA:  Abdominal pain. EXAM: CT ABDOMEN AND PELVIS WITH CONTRAST TECHNIQUE: Multidetector CT imaging of the abdomen and pelvis was performed using the standard protocol following bolus administration of intravenous contrast. CONTRAST:  OMNIPAQUE IOHEXOL 300 MG/ML  SOLN COMPARISON:  None. FINDINGS: Lower chest: Platelike atelectasis in the lingula. The lower chest and lower lungs are otherwise normal. Hepatobiliary: Hepatic steatosis. The liver, gallbladder, and portal vein are otherwise normal. Pancreas: There is a  small probable cyst in the pancreatic tail measuring 8 mm as seen on coronal image 66 and sagittal image 27. The pancreas is otherwise normal. Spleen: Normal in size without focal abnormality. Adrenals/Urinary Tract: Adrenal glands are normal. There is  a cyst in the left kidney best seen on coronal image 73. The kidneys, ureters, and bladder are otherwise normal. Stomach/Bowel: The stomach is normal. There are a few prominent/mildly dilated loops of small bowel in the pelvis such as on series 2, image 67. A representative small bowel loop measures just greater than 3 cm in caliber. No discrete transition point is identified. No wall thickening. The remainder of the small bowel is normal. Scattered colonic diverticuli are identified. There is significant stranding adjacent to the sigmoid colon. There are diverticuli in this region. There is low-attenuation in the sigmoid wall in this region as seen on coronal image 44. The low-attenuation appears to contain more than 1 pocket with a total maximum dimension of 3.5 cm. No definite extraluminal gas. No pericolonic abscess identified. The remainder of the colon is unremarkable. Previous appendectomy. Vascular/Lymphatic: No significant vascular findings are present. No enlarged abdominal or pelvic lymph nodes. Reproductive: Prostate is unremarkable. Other: Fat containing right inguinal hernia.  No free air. Musculoskeletal: No acute or significant osseous findings. IMPRESSION: 1. Sigmoid diverticulitis. Several adjacent pockets of low attenuation within the wall of the sigmoid colon in the region of adjacent stranding is concerning for a developing abscess within the wall of the sigmoid colon. The maximum dimension is 3.5 cm as seen on coronal image 44. No extraluminal gas or pericolonic abscess. 2. A few loops of mildly dilated small bowel are seen in the pelvis. Focal ileus is favored over obstruction. Recommend attention on follow-up and clinical correlation. 3. 8 mm  cyst in the pancreatic tail. Recommend a follow-up CT scan in 1 year. 4. Hepatic steatosis. 5. Left renal cysts. 6. Fat containing inguinal hernia. Electronically Signed   By: Gerome Samavid  Williams III M.D   On: 11/17/2019 12:40    Lab Data:  CBC: Recent Labs  Lab 11/17/19 1110 11/18/19 0430  WBC 19.6* 16.7*  NEUTROABS 15.5*  --   HGB 16.7 14.6  HCT 48.4 45.6  MCV 91.8 97.6  PLT 278 218   Basic Metabolic Panel: Recent Labs  Lab 11/17/19 1110 11/18/19 0430  NA 136 137  K 4.2 4.3  CL 101 103  CO2 23 26  GLUCOSE 113* 110*  BUN 14 14  CREATININE 1.21 1.20  CALCIUM 10.0 8.8*   GFR: Estimated Creatinine Clearance: 99.5 mL/min (by C-G formula based on SCr of 1.2 mg/dL). Liver Function Tests: Recent Labs  Lab 11/17/19 1110  AST 34  ALT 81*  ALKPHOS 60  BILITOT 0.9  PROT 8.1  ALBUMIN 4.7   Recent Labs  Lab 11/17/19 1110  LIPASE 30   No results for input(s): AMMONIA in the last 168 hours. Coagulation Profile: No results for input(s): INR, PROTIME in the last 168 hours. Cardiac Enzymes: No results for input(s): CKTOTAL, CKMB, CKMBINDEX, TROPONINI in the last 168 hours. BNP (last 3 results) No results for input(s): PROBNP in the last 8760 hours. HbA1C: No results for input(s): HGBA1C in the last 72 hours. CBG: No results for input(s): GLUCAP in the last 168 hours. Lipid Profile: No results for input(s): CHOL, HDL, LDLCALC, TRIG, CHOLHDL, LDLDIRECT in the last 72 hours. Thyroid Function Tests: No results for input(s): TSH, T4TOTAL, FREET4, T3FREE, THYROIDAB in the last 72 hours. Anemia Panel: No results for input(s): VITAMINB12, FOLATE, FERRITIN, TIBC, IRON, RETICCTPCT in the last 72 hours. Urine analysis: No results found for: COLORURINE, APPEARANCEUR, LABSPEC, PHURINE, GLUCOSEU, HGBUR, BILIRUBINUR, KETONESUR, PROTEINUR, UROBILINOGEN, NITRITE, LEUKOCYTESUR   Myalynn Lingle M.D. [facility] 11/18/2019, 11:50  AM   Call night coverage person covering after  7pm

## 2019-11-18 NOTE — Progress Notes (Signed)
Patient ID: Justin Benjamin, male   DOB: 29-Jun-1981, 39 y.o.   MRN: [id number]       Subjective: Patient still with a lot of pain.  Taking IV pain medication.  Says his pain is no better today than it was yesterday unless he is taking his pain meds.  Was started on CLD by medicine this morning.  ROS: See above, otherwise other systems negative  Objective: Vital signs in last 24 hours: Temp:  [97.7 F (36.5 C)-98.8 F (37.1 C)] 97.7 F (36.5 C) (04/26 0554) Pulse Rate:  [87-106] 92 (04/26 0554) Resp:  [16-24] 16 (04/26 0554) BP: (110-159)/(78-146) 110/85 (04/26 0554) SpO2:  [91 %-100 %] 92 % (04/26 0554) Weight:  [102 kg] 102 kg (04/25 1650) Last BM Date: 11/17/19  Intake/Output from previous day: 04/25 0701 - 04/26 0700 In: 2785.4 [P.O.:160; I.V.:1475.4; IV Piggyback:1150] Out: 0  Intake/Output this shift: Total I/O In: 435.7 [I.V.:433.8; IV Piggyback:1.9] Out: 0   PE: Heart: regular Lungs: CTAB Abd: soft, but very tender in LLQ and jumped and guarded with palpation to LLQ focally, +BS, ND  Lab Results:  Recent Labs    11/17/19 1110 11/18/19 0430  WBC 19.6* 16.7*  HGB 16.7 14.6  HCT 48.4 45.6  PLT 278 218   BMET Recent Labs    11/17/19 1110 11/18/19 0430  NA 136 137  K 4.2 4.3  CL 101 103  CO2 23 26  GLUCOSE 113* 110*  BUN 14 14  CREATININE 1.21 1.20  CALCIUM 10.0 8.8*   PT/INR No results for input(s): LABPROT, INR in the last 72 hours. CMP     Component Value Date/Time   NA 137 11/18/2019 0430   K 4.3 11/18/2019 0430   CL 103 11/18/2019 0430   CO2 26 11/18/2019 0430   GLUCOSE 110 (H) 11/18/2019 0430   BUN 14 11/18/2019 0430   CREATININE 1.20 11/18/2019 0430   CALCIUM 8.8 (L) 11/18/2019 0430   PROT 8.1 11/17/2019 1110   ALBUMIN 4.7 11/17/2019 1110   AST 34 11/17/2019 1110   ALT 81 (H) 11/17/2019 1110   ALKPHOS 60 11/17/2019 1110   BILITOT 0.9 11/17/2019 1110   GFRNONAA >60 11/18/2019 0430   GFRAA >60 11/18/2019 0430   Lipase       Component Value Date/Time   LIPASE 30 11/17/2019 1110       Studies/Results: CT Abdomen Pelvis W Contrast  Result Date: 11/17/2019 CLINICAL DATA:  Abdominal pain. EXAM: CT ABDOMEN AND PELVIS WITH CONTRAST TECHNIQUE: Multidetector CT imaging of the abdomen and pelvis was performed using the standard protocol following bolus administration of intravenous contrast. CONTRAST:  OMNIPAQUE IOHEXOL 300 MG/ML  SOLN COMPARISON:  None. FINDINGS: Lower chest: Platelike atelectasis in the lingula. The lower chest and lower lungs are otherwise normal. Hepatobiliary: Hepatic steatosis. The liver, gallbladder, and portal vein are otherwise normal. Pancreas: There is a small probable cyst in the pancreatic tail measuring 8 mm as seen on coronal image 66 and sagittal image 27. The pancreas is otherwise normal. Spleen: Normal in size without focal abnormality. Adrenals/Urinary Tract: Adrenal glands are normal. There is a cyst in the left kidney best seen on coronal image 73. The kidneys, ureters, and bladder are otherwise normal. Stomach/Bowel: The stomach is normal. There are a few prominent/mildly dilated loops of small bowel in the pelvis such as on series 2, image 67. A representative small bowel loop measures just greater than 3 cm in caliber. No discrete transition point is identified. No wall  thickening. The remainder of the small bowel is normal. Scattered colonic diverticuli are identified. There is significant stranding adjacent to the sigmoid colon. There are diverticuli in this region. There is low-attenuation in the sigmoid wall in this region as seen on coronal image 44. The low-attenuation appears to contain more than 1 pocket with a total maximum dimension of 3.5 cm. No definite extraluminal gas. No pericolonic abscess identified. The remainder of the colon is unremarkable. Previous appendectomy. Vascular/Lymphatic: No significant vascular findings are present. No enlarged abdominal or pelvic lymph  nodes. Reproductive: Prostate is unremarkable. Other: Fat containing right inguinal hernia.  No free air. Musculoskeletal: No acute or significant osseous findings. IMPRESSION: 1. Sigmoid diverticulitis. Several adjacent pockets of low attenuation within the wall of the sigmoid colon in the region of adjacent stranding is concerning for a developing abscess within the wall of the sigmoid colon. The maximum dimension is 3.5 cm as seen on coronal image 44. No extraluminal gas or pericolonic abscess. 2. A few loops of mildly dilated small bowel are seen in the pelvis. Focal ileus is favored over obstruction. Recommend attention on follow-up and clinical correlation. 3. 8 mm cyst in the pancreatic tail. Recommend a follow-up CT scan in 1 year. 4. Hepatic steatosis. 5. Left renal cysts. 6. Fat containing inguinal hernia. Electronically Signed   By: Dorise Bullion III M.D   On: 11/17/2019 12:40    Anti-infectives: Anti-infectives (From admission, onward)   Start     Dose/Rate Route Frequency Ordered Stop   11/17/19 1800  piperacillin-tazobactam (ZOSYN) IVPB 3.375 g     3.375 g 12.5 mL/hr over 240 Minutes Intravenous Every 8 hours 11/17/19 1310     11/17/19 1315  piperacillin-tazobactam (ZOSYN) IVPB 3.375 g     3.375 g 100 mL/hr over 30 Minutes Intravenous  Once 11/17/19 1310 11/17/19 1350       Assessment/Plan Diverticulitis with phlegmon -first episode -will try to get better with bowel rest and conservative measures -will need colonoscopy as outpatient -can continue CLD but if continues to have as much pain as he is having, may need to back off -cont zosyn   FEN - IVFs/CLD VTE - ok for chemical prophylaxis from our standpoint ID - zosyn 4/25 -->   LOS: 1 day    Henreitta Cea , [facility] Surgery 11/18/2019, 11:50 AM Please see Amion for pager number during day hours 7:00am-4:30pm or 7:00am -11:30am on weekends

## 2019-11-18 NOTE — Progress Notes (Signed)
Interventional Radiology Brief Note:  IR consulted for possible aspiration/drainage of diverticular abscess.  Currently, abscess is within the wall of the colon.  No drainable collection.  Appears surgery has already been consulted.   IR available if future need arises.   Loyce Dys, MS RD PA-C 8:41 AM

## 2019-11-19 LAB — CBC
HCT: 39.5 % (ref 39.0–52.0)
Hemoglobin: 12.7 g/dL — ABNORMAL LOW (ref 13.0–17.0)
MCH: 31.1 pg (ref 26.0–34.0)
MCHC: 32.2 g/dL (ref 30.0–36.0)
MCV: 96.8 fL (ref 80.0–100.0)
Platelets: 194 10*3/uL (ref 150–400)
RBC: 4.08 MIL/uL — ABNORMAL LOW (ref 4.22–5.81)
RDW: 11.9 % (ref 11.5–15.5)
WBC: 13.6 10*3/uL — ABNORMAL HIGH (ref 4.0–10.5)
nRBC: 0 % (ref 0.0–0.2)

## 2019-11-19 LAB — BASIC METABOLIC PANEL
Anion gap: 9 (ref 5–15)
BUN: 10 mg/dL (ref 6–20)
CO2: 22 mmol/L (ref 22–32)
Calcium: 8.4 mg/dL — ABNORMAL LOW (ref 8.9–10.3)
Chloride: 102 mmol/L (ref 98–111)
Creatinine, Ser: 0.86 mg/dL (ref 0.61–1.24)
GFR calc Af Amer: >60 mL/min (ref >60)
GFR calc non Af Amer: >60 mL/min (ref >60)
Glucose, Bld: 94 mg/dL (ref 70–99)
Potassium: 3.6 mmol/L (ref 3.5–5.1)
Sodium: 133 mmol/L — ABNORMAL LOW (ref 135–145)

## 2019-11-19 NOTE — Progress Notes (Addendum)
PROGRESS NOTE    Justin Benjamin  YPP:[id number] DOB: August 01, 1980 DOA: 11/17/2019 PCP: Patient, No Pcp Per    Brief Narrative:  Justin Benjamin is a4 year old male with no significant past medical history presented from [place] with severe left lower quadrant abdominal pain that started at 8 PM on 4/24, day before the admission. Patient reported that he went to [employer] for dinner, came home around 7 PM. About an hour later, patient started having significant left lower quadrant pain which progressively got worse, 10 out of 10 in intensity sharp and constant, no radiation. Denied any nausea vomiting or diarrhea. Felt chills but no fevers. Had no prior history of diverticulitis or similar pain in the past.   CT abdomen showed sigmoid diverticulitis, concern for developing abscess maximum dimension 3.5 cm   Assessment & Plan:   Principal Problem:   Diverticulitis of large intestine with abscess  Acute sigmoid diverticulitis with developing abscess Presenting with severe left lower quadrant pain.  CT abdomen/pelvis notable for sigmoid diverticulitis with developing abscess 3.5 cm, questionable ileus.  IR was consulted for possible drainage, unable to drain secondary to abscess is within the wall of the colon.  Patient reports this is his first episode of diverticulitis. --General surgery following, appreciate assistance --WBC 19.6>16.7>13.6 --Advancing diet to full liquid today --Continue NS at 125 mL's per hour --Continue empiric antibiotics with Zosyn --Follow CBC daily --We will need colonoscopy outpatient  DVT prophylaxis: Heparin Code Status: Full code Family Communication: Discussed with patient extensively at bedside  Disposition Plan:  Status is: Inpatient  Remains inpatient appropriate because:Ongoing active pain requiring inpatient pain management, Unsafe d/c plan, IV treatments appropriate due to intensity of illness or inability to take  PO and Inpatient level of care appropriate due to severity of illness   Dispo: The patient is from: Home              Anticipated d/c is to: Home              Anticipated d/c date is: 2 days              Patient currently is not medically stable to d/c.    Consultants:   General surgery  Procedures:   None  Antimicrobials:   Zosyn 4/25>>   Subjective: Patient seen and examined bedside, resting comfortably.  States pain is much better controlled today.  Continues on IV antibiotics.  Advancing diet per general surgery today.  No other complaints or concerns at this time.  Denies headache, no visual changes, no chest pain, no palpitations, no fever/chills/night sweats, no nausea/vomiting/diarrhea, no weakness, no cough/congestion, no fatigue.  No acute events overnight per nursing staff.  Objective: Vitals:   11/18/19 0554 11/18/19 1312 11/18/19 2206 11/19/19 0651  BP: 110/85 125/72 129/88 124/85  Pulse: 92 95 82 86  Resp: 16  18 18   Temp: 97.7 F (36.5 C) 98.6 F (37 C) 98.2 F (36.8 C) 98.4 F (36.9 C)  TempSrc: Oral  Oral Oral  SpO2: 92% 94% 95% 94%  Weight:      Height:        Intake/Output Summary (Last 24 hours) at 11/19/2019 1255 Last data filed at 11/19/2019 0600 Gross per 24 hour  Intake 3319.88 ml  Output 0 ml  Net 3319.88 ml   Filed Weights   11/17/19 1102 11/17/19 1650  Weight: 102.5 kg 102 kg    Examination:  General exam: Appears calm and comfortable  Respiratory system: Clear  to auscultation. Respiratory effort normal. Cardiovascular system: S1 & S2 heard, RRR. No JVD, murmurs, rubs, gallops or clicks. No pedal edema. Gastrointestinal system: Abdomen is nondistended, soft with mild left lower quadrant tenderness. No organomegaly or masses felt. Normal bowel sounds heard. Central nervous system: Alert and oriented. No focal neurological deficits. Extremities: Symmetric 5 x 5 power. Skin: No rashes, lesions or ulcers Psychiatry: Judgement and  insight appear normal. Mood & affect appropriate.     Data Reviewed: I have personally reviewed following labs and imaging studies  CBC: Recent Labs  Lab 11/17/19 1110 11/18/19 0430 11/19/19 0435  WBC 19.6* 16.7* 13.6*  NEUTROABS 15.5*  --   --   HGB 16.7 14.6 12.7*  HCT 48.4 45.6 39.5  MCV 91.8 97.6 96.8  PLT 278 218 194   Basic Metabolic Panel: Recent Labs  Lab 11/17/19 1110 11/18/19 0430 11/19/19 0435  NA 136 137 133*  K 4.2 4.3 3.6  CL 101 103 102  CO2 23 26 22   GLUCOSE 113* 110* 94  BUN 14 14 10   CREATININE 1.21 1.20 0.86  CALCIUM 10.0 8.8* 8.4*   GFR: Estimated Creatinine Clearance: 138.9 mL/min (by C-G formula based on SCr of 0.86 mg/dL). Liver Function Tests: Recent Labs  Lab 11/17/19 1110  AST 34  ALT 81*  ALKPHOS 60  BILITOT 0.9  PROT 8.1  ALBUMIN 4.7   Recent Labs  Lab 11/17/19 1110  LIPASE 30   No results for input(s): AMMONIA in the last 168 hours. Coagulation Profile: No results for input(s): INR, PROTIME in the last 168 hours. Cardiac Enzymes: No results for input(s): CKTOTAL, CKMB, CKMBINDEX, TROPONINI in the last 168 hours. BNP (last 3 results) No results for input(s): PROBNP in the last 8760 hours. HbA1C: No results for input(s): HGBA1C in the last 72 hours. CBG: No results for input(s): GLUCAP in the last 168 hours. Lipid Profile: No results for input(s): CHOL, HDL, LDLCALC, TRIG, CHOLHDL, LDLDIRECT in the last 72 hours. Thyroid Function Tests: No results for input(s): TSH, T4TOTAL, FREET4, T3FREE, THYROIDAB in the last 72 hours. Anemia Panel: No results for input(s): VITAMINB12, FOLATE, FERRITIN, TIBC, IRON, RETICCTPCT in the last 72 hours. Sepsis Labs: No results for input(s): PROCALCITON, LATICACIDVEN in the last 168 hours.  Recent Results (from the past 240 hour(s))  Respiratory Panel by RT PCR (Flu A&B, Covid) - Nasopharyngeal Swab     Status: None   Collection Time: 11/17/19  1:26 PM   Specimen: Nasopharyngeal Swab    Result Value Ref Range Status   SARS Coronavirus 2 by RT PCR NEGATIVE NEGATIVE Final    Comment: (NOTE) SARS-CoV-2 target nucleic acids are NOT DETECTED. The SARS-CoV-2 RNA is generally detectable in upper respiratoy specimens during the acute phase of infection. The lowest concentration of SARS-CoV-2 viral copies this assay can detect is 131 copies/mL. A negative result does not preclude SARS-Cov-2 infection and should not be used as the sole basis for treatment or other patient management decisions. A negative result may occur with  improper specimen collection/handling, submission of specimen other than nasopharyngeal swab, presence of viral mutation(s) within the areas targeted by this assay, and inadequate number of viral copies (<131 copies/mL). A negative result must be combined with clinical observations, patient history, and epidemiological information. The expected result is Negative. Fact Sheet for Patients:  11/19/19 Fact Sheet for Healthcare Providers:  11/19/19 This test is not yet ap proved or cleared by the https://www.moore.com/ FDA and  has been authorized for detection  and/or diagnosis of SARS-CoV-2 by FDA under an Emergency Use Authorization (EUA). This EUA will remain  in effect (meaning this test can be used) for the duration of the COVID-19 declaration under Section 564(b)(1) of the Act, 21 U.S.C. section 360bbb-3(b)(1), unless the authorization is terminated or revoked sooner.    Influenza A by PCR NEGATIVE NEGATIVE Final   Influenza B by PCR NEGATIVE NEGATIVE Final    Comment: (NOTE) The Xpert Xpress SARS-CoV-2/FLU/RSV assay is intended as an aid in  the diagnosis of influenza from Nasopharyngeal swab specimens and  should not be used as a sole basis for treatment. Nasal washings and  aspirates are unacceptable for Xpert Xpress SARS-CoV-2/FLU/RSV  testing. Fact Sheet for  Patients: https://www.moore.com/ Fact Sheet for Healthcare Providers: https://www.young.biz/ This test is not yet approved or cleared by the [place] FDA and  has been authorized for detection and/or diagnosis of SARS-CoV-2 by  FDA under an Emergency Use Authorization (EUA). This EUA will remain  in effect (meaning this test can be used) for the duration of the  Covid-19 declaration under Section 564(b)(1) of the Act, 21  U.S.C. section 360bbb-3(b)(1), unless the authorization is  terminated or revoked. Performed at [facility], [address] [place], [place] [address]   Culture, blood (Routine X 2) w Reflex to ID Panel     Status: None (Preliminary result)   Collection Time: 11/17/19  6:54 PM   Specimen: Right Antecubital; Blood  Result Value Ref Range Status   Specimen Description   Final    RIGHT ANTECUBITAL Performed at [facility], [address], [place], [place] [address]    Special Requests   Final    BOTTLES DRAWN AEROBIC ONLY Blood Culture adequate volume Performed at [facility] [place], [address] [place], [place] [address]    Culture   Final    NO GROWTH 2 DAYS Performed at [facility], [address], [place], [place] [address]    Report Status PENDING  Incomplete  Culture, blood (Routine X 2) w Reflex to ID Panel     Status: None (Preliminary result)   Collection Time: 11/17/19  6:54 PM   Specimen: BLOOD LEFT HAND  Result Value Ref Range Status   Specimen Description   Final    BLOOD LEFT HAND Performed at [facility], [address] [place], [place] [address]    Special Requests   Final    BOTTLES DRAWN AEROBIC ONLY Blood Culture adequate volume Performed at [facility], [address], [place], [place] [address]    Culture   Final    NO GROWTH 2 DAYS Performed at [facility], [address] [place],  [place] [address]    Report Status PENDING  Incomplete  MRSA PCR Screening     Status: None   Collection Time: 11/18/19  5:00 AM   Specimen: Nasal Mucosa; Nasopharyngeal  Result Value Ref Range Status   MRSA by PCR NEGATIVE NEGATIVE Final    Comment: Performed at [facility], [address] [place], [place] [address]         Radiology Studies: No results found.      Scheduled Meds: . heparin injection (subcutaneous)  5,000 Units Subcutaneous Q8H   Continuous Infusions: . sodium chloride 125 mL/hr at 11/19/19 0129  . sodium chloride 250 mL (11/17/19 1830)  . piperacillin-tazobactam (ZOSYN)  IV 3.375 g (11/19/19 0919)  LOS: 2 days    Time spent: 35 minutes spent on chart review, discussion with nursing staff, consultants, updating family and interview/physical exam; more than 50% of that time was spent in counseling and/or coordination of care.    Alvira Philips [place], DO [facility] Available via Epic secure chat 7am-7pm After these hours, please refer to coverage provider listed on [email or web address] 11/19/2019, 12:55 PM

## 2019-11-19 NOTE — Progress Notes (Signed)
Patient ID: Justin Benjamin, male   DOB: [birth year], 39 y.o.   MRN: [id number]       Subjective: Feels better today.  Stopped taking dilaudid but is still taking vicodin about every 4-5 hrs he states.  No issues with CLD, but doesn't really like it so not eating much  ROS: See above, otherwise other systems negative  Objective: Vital signs in last 24 hours: Temp:  [98.2 F (36.8 C)-98.6 F (37 C)] 98.4 F (36.9 C) (04/27 0651) Pulse Rate:  [82-95] 86 (04/27 0651) Resp:  [18] 18 (04/27 0651) BP: (124-129)/(72-88) 124/85 (04/27 0651) SpO2:  [94 %-95 %] 94 % (04/27 0651) Last BM Date: 11/17/19  Intake/Output from previous day: 04/26 0701 - 04/27 0700 In: 3755.6 [P.O.:600; I.V.:3005.6; IV Piggyback:150] Out: 0  Intake/Output this shift: No intake/output data recorded.  PE: Heart: regular Lungs: CTAB Abd: soft, still tender in LLQ, but somewhat better than yesterday, +BS, ND  Lab Results:  Recent Labs    11/18/19 0430 11/19/19 0435  WBC 16.7* 13.6*  HGB 14.6 12.7*  HCT 45.6 39.5  PLT 218 194   BMET Recent Labs    11/18/19 0430 11/19/19 0435  NA 137 133*  K 4.3 3.6  CL 103 102  CO2 26 22  GLUCOSE 110* 94  BUN 14 10  CREATININE 1.20 0.86  CALCIUM 8.8* 8.4*   PT/INR No results for input(s): LABPROT, INR in the last 72 hours. CMP     Component Value Date/Time   NA 133 (L) 11/19/2019 0435   K 3.6 11/19/2019 0435   CL 102 11/19/2019 0435   CO2 22 11/19/2019 0435   GLUCOSE 94 11/19/2019 0435   BUN 10 11/19/2019 0435   CREATININE 0.86 11/19/2019 0435   CALCIUM 8.4 (L) 11/19/2019 0435   PROT 8.1 11/17/2019 1110   ALBUMIN 4.7 11/17/2019 1110   AST 34 11/17/2019 1110   ALT 81 (H) 11/17/2019 1110   ALKPHOS 60 11/17/2019 1110   BILITOT 0.9 11/17/2019 1110   GFRNONAA >60 11/19/2019 0435   GFRAA >60 11/19/2019 0435   Lipase     Component Value Date/Time   LIPASE 30 11/17/2019 1110       Studies/Results: CT Abdomen Pelvis W Contrast  Result Date:  11/17/2019 CLINICAL DATA:  Abdominal pain. EXAM: CT ABDOMEN AND PELVIS WITH CONTRAST TECHNIQUE: Multidetector CT imaging of the abdomen and pelvis was performed using the standard protocol following bolus administration of intravenous contrast. CONTRAST:  148mL OMNIPAQUE IOHEXOL 300 MG/ML  SOLN COMPARISON:  None. FINDINGS: Lower chest: Platelike atelectasis in the lingula. The lower chest and lower lungs are otherwise normal. Hepatobiliary: Hepatic steatosis. The liver, gallbladder, and portal vein are otherwise normal. Pancreas: There is a small probable cyst in the pancreatic tail measuring 8 mm as seen on coronal image 66 and sagittal image 27. The pancreas is otherwise normal. Spleen: Normal in size without focal abnormality. Adrenals/Urinary Tract: Adrenal glands are normal. There is a cyst in the left kidney best seen on coronal image 73. The kidneys, ureters, and bladder are otherwise normal. Stomach/Bowel: The stomach is normal. There are a few prominent/mildly dilated loops of small bowel in the pelvis such as on series 2, image 67. A representative small bowel loop measures just greater than 3 cm in caliber. No discrete transition point is identified. No wall thickening. The remainder of the small bowel is normal. Scattered colonic diverticuli are identified. There is significant stranding adjacent to the sigmoid colon. There are diverticuli in this  region. There is low-attenuation in the sigmoid wall in this region as seen on coronal image 44. The low-attenuation appears to contain more than 1 pocket with a total maximum dimension of 3.5 cm. No definite extraluminal gas. No pericolonic abscess identified. The remainder of the colon is unremarkable. Previous appendectomy. Vascular/Lymphatic: No significant vascular findings are present. No enlarged abdominal or pelvic lymph nodes. Reproductive: Prostate is unremarkable. Other: Fat containing right inguinal hernia.  No free air. Musculoskeletal: No acute or  significant osseous findings. IMPRESSION: 1. Sigmoid diverticulitis. Several adjacent pockets of low attenuation within the wall of the sigmoid colon in the region of adjacent stranding is concerning for a developing abscess within the wall of the sigmoid colon. The maximum dimension is 3.5 cm as seen on coronal image 44. No extraluminal gas or pericolonic abscess. 2. A few loops of mildly dilated small bowel are seen in the pelvis. Focal ileus is favored over obstruction. Recommend attention on follow-up and clinical correlation. 3. 8 mm cyst in the pancreatic tail. Recommend a follow-up CT scan in 1 year. 4. Hepatic steatosis. 5. Left renal cysts. 6. Fat containing inguinal hernia. Electronically Signed   By: Gerome Sam III M.D   On: 11/17/2019 12:40    Anti-infectives: Anti-infectives (From admission, onward)   Start     Dose/Rate Route Frequency Ordered Stop   11/17/19 1800  piperacillin-tazobactam (ZOSYN) IVPB 3.375 g     3.375 g 12.5 mL/hr over 240 Minutes Intravenous Every 8 hours 11/17/19 1310     11/17/19 1315  piperacillin-tazobactam (ZOSYN) IVPB 3.375 g     3.375 g 100 mL/hr over 30 Minutes Intravenous  Once 11/17/19 1310 11/17/19 1350       Assessment/Plan Diverticulitis with phlegmon -first episode -will try to get better with bowel rest and conservative measures -will need colonoscopy as outpatient -try FLD diet today -follow WBC, down to 13K today -cont zosyn   FEN - IVFs/FLD VTE - heparin ID - zosyn 4/25 -->   LOS: 2 days    Letha Cape , [facility] Surgery 11/19/2019, 10:06 AM Please see Amion for pager number during day hours 7:00am-4:30pm or 7:00am -11:30am on weekends

## 2019-11-20 LAB — BASIC METABOLIC PANEL
Anion gap: 7 (ref 5–15)
BUN: 8 mg/dL (ref 6–20)
CO2: 25 mmol/L (ref 22–32)
Calcium: 8.6 mg/dL — ABNORMAL LOW (ref 8.9–10.3)
Chloride: 105 mmol/L (ref 98–111)
Creatinine, Ser: 0.96 mg/dL (ref 0.61–1.24)
GFR calc Af Amer: >60 mL/min (ref >60)
GFR calc non Af Amer: >60 mL/min (ref >60)
Glucose, Bld: 98 mg/dL (ref 70–99)
Potassium: 4.2 mmol/L (ref 3.5–5.1)
Sodium: 137 mmol/L (ref 135–145)

## 2019-11-20 LAB — CBC
HCT: 38.4 % — ABNORMAL LOW (ref 39.0–52.0)
Hemoglobin: 12.9 g/dL — ABNORMAL LOW (ref 13.0–17.0)
MCH: 32.3 pg (ref 26.0–34.0)
MCHC: 33.6 g/dL (ref 30.0–36.0)
MCV: 96.2 fL (ref 80.0–100.0)
Platelets: 201 10*3/uL (ref 150–400)
RBC: 3.99 MIL/uL — ABNORMAL LOW (ref 4.22–5.81)
RDW: 11.9 % (ref 11.5–15.5)
WBC: 8 10*3/uL (ref 4.0–10.5)
nRBC: 0 % (ref 0.0–0.2)

## 2019-11-20 LAB — MAGNESIUM: Magnesium: 2.3 mg/dL (ref 1.7–2.4)

## 2019-11-20 MED ORDER — HYDROCODONE-ACETAMINOPHEN 5-325 MG PO TABS: 1.0000 | ORAL_TABLET | Freq: Four times a day (QID) | ORAL | 0 refills | Status: AC | PRN

## 2019-11-20 MED ORDER — AMOXICILLIN-POT CLAVULANATE 875-125 MG PO TABS: 1.0000 | ORAL_TABLET | Freq: Two times a day (BID) | ORAL | 0 refills | Status: AC

## 2019-11-20 NOTE — Progress Notes (Signed)
PROGRESS NOTE    Justin Benjamin  PNT:[id number] DOB: November 22, 1980 DOA: 11/17/2019 PCP: Patient, No Pcp Per    Brief Narrative:  Justin Benjamin is a68 year old male with no significant past medical history presented from [facility] with severe left lower quadrant abdominal pain that started at 8 PM on 4/24, day before the admission. Patient reported that he went to [employer] for dinner, came home around 7 PM. About an hour later, patient started having significant left lower quadrant pain which progressively got worse, 10 out of 10 in intensity sharp and constant, no radiation. Denied any nausea vomiting or diarrhea. Felt chills but no fevers. Had no prior history of diverticulitis or similar pain in the past.   CT abdomen showed sigmoid diverticulitis, concern for developing abscess maximum dimension 3.5 cm   Assessment & Plan:   Principal Problem:   Diverticulitis of large intestine with abscess  Acute sigmoid diverticulitis with developing abscess Presenting with severe left lower quadrant pain.  CT abdomen/pelvis notable for sigmoid diverticulitis with developing abscess 3.5 cm, questionable ileus.  IR was consulted for possible drainage, unable to drain secondary to abscess is within the wall of the colon.  Patient reports this is his first episode of diverticulitis. --General surgery following, appreciate assistance --WBC 19.6>16.7>13.6>8.0 --Advancing diet to soft today by general surgery --Continue empiric antibiotics with Zosyn; likely to change to Augmentin to complete antibiotic course on discharge --Follow CBC daily --Will need colonoscopy outpatient --Possible discharge today versus tomorrow depending on toleration of advance diet  DVT prophylaxis: Heparin Code Status: Full code Family Communication: Discussed with patient extensively at bedside  Disposition Plan:  Status is: Inpatient  Remains inpatient appropriate because:Ongoing active  pain requiring inpatient pain management, Unsafe d/c plan, IV treatments appropriate due to intensity of illness or inability to take PO and Inpatient level of care appropriate due to severity of illness   Dispo: The patient is from: Home              Anticipated d/c is to: Home              Anticipated d/c date is: 1 day              Patient currently is not medically stable to d/c.    Consultants:   General surgery  Procedures:   None  Antimicrobials:   Zosyn 4/25>>   Subjective: Patient seen and examined bedside, resting comfortably.  States pain is much better controlled today.  Continues on IV antibiotics.  White blood cell count now normalized.  Advancing diet to soft per general surgery today; with possible of discharge today versus tomorrow depending on toleration.  No other complaints or concerns at this time.  Denies headache, no visual changes, no chest pain, no palpitations, no fever/chills/night sweats, no nausea/vomiting/diarrhea, no weakness, no cough/congestion, no fatigue.  No acute events overnight per nursing staff.  Objective: Vitals:   11/19/19 2121 11/20/19 0532 11/20/19 1022 11/20/19 1338  BP: (!) 144/93 (!) 133/91 134/88 (!) 131/91  Pulse: 80 66 77 79  Resp: 19 20 16 16   Temp: 98.9 F (37.2 C) 97.8 F (36.6 C) 98 F (36.7 C) 98 F (36.7 C)  TempSrc: Oral Oral Oral Oral  SpO2: 94% 95% 96% 96%  Weight:      Height:        Intake/Output Summary (Last 24 hours) at 11/20/2019 1409 Last data filed at 11/20/2019 1400 Gross per 24 hour  Intake 4028.14 ml  Output 0 ml  Net 4028.14 ml   Filed Weights   11/17/19 1102 11/17/19 1650  Weight: 102.5 kg 102 kg    Examination:  General exam: Appears calm and comfortable  Respiratory system: Clear to auscultation. Respiratory effort normal. Cardiovascular system: S1 & S2 heard, RRR. No JVD, murmurs, rubs, gallops or clicks. No pedal edema. Gastrointestinal system: Abdomen is nondistended, soft with mild  left lower quadrant tenderness. No organomegaly or masses felt. Normal bowel sounds heard. Central nervous system: Alert and oriented. No focal neurological deficits. Extremities: Symmetric 5 x 5 power. Skin: No rashes, lesions or ulcers Psychiatry: Judgement and insight appear normal. Mood & affect appropriate.     Data Reviewed: I have personally reviewed following labs and imaging studies  CBC: Recent Labs  Lab 11/17/19 1110 11/18/19 0430 11/19/19 0435 11/20/19 0413  WBC 19.6* 16.7* 13.6* 8.0  NEUTROABS 15.5*  --   --   --   HGB 16.7 14.6 12.7* 12.9*  HCT 48.4 45.6 39.5 38.4*  MCV 91.8 97.6 96.8 96.2  PLT 278 218 194 201   Basic Metabolic Panel: Recent Labs  Lab 11/17/19 1110 11/18/19 0430 11/19/19 0435 11/20/19 0413  NA 136 137 133* 137  K 4.2 4.3 3.6 4.2  CL 101 103 102 105  CO2 23 26 22 25   GLUCOSE 113* 110* 94 98  BUN 14 14 10 8   CREATININE 1.21 1.20 0.86 0.96  CALCIUM 10.0 8.8* 8.4* 8.6*  MG  --   --   --  2.3   GFR: Estimated Creatinine Clearance: 124.4 mL/min (by C-G formula based on SCr of 0.96 mg/dL). Liver Function Tests: Recent Labs  Lab 11/17/19 1110  AST 34  ALT 81*  ALKPHOS 60  BILITOT 0.9  PROT 8.1  ALBUMIN 4.7   Recent Labs  Lab 11/17/19 1110  LIPASE 30   No results for input(s): AMMONIA in the last 168 hours. Coagulation Profile: No results for input(s): INR, PROTIME in the last 168 hours. Cardiac Enzymes: No results for input(s): CKTOTAL, CKMB, CKMBINDEX, TROPONINI in the last 168 hours. BNP (last 3 results) No results for input(s): PROBNP in the last 8760 hours. HbA1C: No results for input(s): HGBA1C in the last 72 hours. CBG: No results for input(s): GLUCAP in the last 168 hours. Lipid Profile: No results for input(s): CHOL, HDL, LDLCALC, TRIG, CHOLHDL, LDLDIRECT in the last 72 hours. Thyroid Function Tests: No results for input(s): TSH, T4TOTAL, FREET4, T3FREE, THYROIDAB in the last 72 hours. Anemia Panel: No results  for input(s): VITAMINB12, FOLATE, FERRITIN, TIBC, IRON, RETICCTPCT in the last 72 hours. Sepsis Labs: No results for input(s): PROCALCITON, LATICACIDVEN in the last 168 hours.  Recent Results (from the past 240 hour(s))  Respiratory Panel by RT PCR (Flu A&B, Covid) - Nasopharyngeal Swab     Status: None   Collection Time: 11/17/19  1:26 PM   Specimen: Nasopharyngeal Swab  Result Value Ref Range Status   SARS Coronavirus 2 by RT PCR NEGATIVE NEGATIVE Final    Comment: (NOTE) SARS-CoV-2 target nucleic acids are NOT DETECTED. The SARS-CoV-2 RNA is generally detectable in upper respiratoy specimens during the acute phase of infection. The lowest concentration of SARS-CoV-2 viral copies this assay can detect is 131 copies/mL. A negative result does not preclude SARS-Cov-2 infection and should not be used as the sole basis for treatment or other patient management decisions. A negative result may occur with  improper specimen collection/handling, submission of specimen other than nasopharyngeal swab, presence of viral  mutation(s) within the areas targeted by this assay, and inadequate number of viral copies (<131 copies/mL). A negative result must be combined with clinical observations, patient history, and epidemiological information. The expected result is Negative. Fact Sheet for Patients:  https://www.moore.com/ Fact Sheet for Healthcare Providers:  https://www.young.biz/ This test is not yet ap proved or cleared by the [place] FDA and  has been authorized for detection and/or diagnosis of SARS-CoV-2 by FDA under an Emergency Use Authorization (EUA). This EUA will remain  in effect (meaning this test can be used) for the duration of the COVID-19 declaration under Section 564(b)(1) of the Act, 21 U.S.C. section 360bbb-3(b)(1), unless the authorization is terminated or revoked sooner.    Influenza A by PCR NEGATIVE NEGATIVE Final    Influenza B by PCR NEGATIVE NEGATIVE Final    Comment: (NOTE) The Xpert Xpress SARS-CoV-2/FLU/RSV assay is intended as an aid in  the diagnosis of influenza from Nasopharyngeal swab specimens and  should not be used as a sole basis for treatment. Nasal washings and  aspirates are unacceptable for Xpert Xpress SARS-CoV-2/FLU/RSV  testing. Fact Sheet for Patients: https://www.moore.com/ Fact Sheet for Healthcare Providers: https://www.young.biz/ This test is not yet approved or cleared by the [place] FDA and  has been authorized for detection and/or diagnosis of SARS-CoV-2 by  FDA under an Emergency Use Authorization (EUA). This EUA will remain  in effect (meaning this test can be used) for the duration of the  Covid-19 declaration under Section 564(b)(1) of the Act, 21  U.S.C. section 360bbb-3(b)(1), unless the authorization is  terminated or revoked. Performed at [facility], [address] [place], [place] [address]   Culture, blood (Routine X 2) w Reflex to ID Panel     Status: None (Preliminary result)   Collection Time: 11/17/19  6:54 PM   Specimen: Right Antecubital; Blood  Result Value Ref Range Status   Specimen Description   Final    RIGHT ANTECUBITAL Performed at [facility], [address], [place], [place] [address]    Special Requests   Final    BOTTLES DRAWN AEROBIC ONLY Blood Culture adequate volume Performed at [facility], [address], [place], [place] [address]    Culture   Final    NO GROWTH 3 DAYS Performed at [facility], [address], [place], [place] [address]    Report Status PENDING  Incomplete  Culture, blood (Routine X 2) w Reflex to ID Panel     Status: None (Preliminary result)   Collection Time: 11/17/19  6:54 PM   Specimen: BLOOD LEFT HAND  Result Value Ref Range Status   Specimen Description   Final    BLOOD LEFT HAND Performed at  [facility], [address] [place], [place] [address]    Special Requests   Final    BOTTLES DRAWN AEROBIC ONLY Blood Culture adequate volume Performed at [facility], [address], [place], [place] [address]    Culture   Final    NO GROWTH 3 DAYS Performed at [facility], [address], [place], [place] [address]    Report Status PENDING  Incomplete  MRSA PCR Screening     Status: None   Collection Time: 11/18/19  5:00 AM   Specimen: Nasal Mucosa; Nasopharyngeal  Result Value Ref Range Status   MRSA by PCR NEGATIVE NEGATIVE Final    Comment: Performed at [facility],  [address], [place], [place] [address]         Radiology Studies: No results found.      Scheduled Meds: . heparin injection (subcutaneous)  5,000 Units Subcutaneous Q8H   Continuous Infusions: . sodium chloride 250 mL (11/17/19 1830)  . piperacillin-tazobactam (ZOSYN)  IV 3.375 g (11/20/19 1002)     LOS: 3 days    Time spent: 35 minutes spent on chart review, discussion with nursing staff, consultants, updating family and interview/physical exam; more than 50% of that time was spent in counseling and/or coordination of care.    Alvira Philips [place], DO [facility] Available via Epic secure chat 7am-7pm After these hours, please refer to coverage provider listed on [email or web address] 11/20/2019, 2:09 PM

## 2019-11-20 NOTE — Progress Notes (Signed)
Patient ID: Justin Benjamin, male   DOB: [birth year], 39 y.o.   MRN: [id number]       Subjective: Feeling better today.  No vicodin since overnight.  Tolerating full liquids well with no other issues.  Had a BM and feels better from that as well.  ROS: See above, otherwise other systems negative  Objective: Vital signs in last 24 hours: Temp:  [97.8 F (36.6 C)-98.9 F (37.2 C)] 97.8 F (36.6 C) (04/28 0532) Pulse Rate:  [66-83] 66 (04/28 0532) Resp:  [17-20] 20 (04/28 0532) BP: (130-144)/(90-93) 133/91 (04/28 0532) SpO2:  [94 %-97 %] 95 % (04/28 0532) Last BM Date: 11/20/19  Intake/Output from previous day: 04/27 0701 - 04/28 0700 In: 3259.4 [P.O.:340; I.V.:2769.3; IV Piggyback:150] Out: 0  Intake/Output this shift: No intake/output data recorded.  PE: Abd: soft, much less tender in LLQ, +BS, ND  Lab Results:  Recent Labs    11/19/19 0435 11/20/19 0413  WBC 13.6* 8.0  HGB 12.7* 12.9*  HCT 39.5 38.4*  PLT 194 201   BMET Recent Labs    11/19/19 0435 11/20/19 0413  NA 133* 137  K 3.6 4.2  CL 102 105  CO2 22 25  GLUCOSE 94 98  BUN 10 8  CREATININE 0.86 0.96  CALCIUM 8.4* 8.6*   PT/INR No results for input(s): LABPROT, INR in the last 72 hours. CMP     Component Value Date/Time   NA 137 11/20/2019 0413   K 4.2 11/20/2019 0413   CL 105 11/20/2019 0413   CO2 25 11/20/2019 0413   GLUCOSE 98 11/20/2019 0413   BUN 8 11/20/2019 0413   CREATININE 0.96 11/20/2019 0413   CALCIUM 8.6 (L) 11/20/2019 0413   PROT 8.1 11/17/2019 1110   ALBUMIN 4.7 11/17/2019 1110   AST 34 11/17/2019 1110   ALT 81 (H) 11/17/2019 1110   ALKPHOS 60 11/17/2019 1110   BILITOT 0.9 11/17/2019 1110   GFRNONAA >60 11/20/2019 0413   GFRAA >60 11/20/2019 0413   Lipase     Component Value Date/Time   LIPASE 30 11/17/2019 1110       Studies/Results: No results found.  Anti-infectives: Anti-infectives (From admission, onward)   Start     Dose/Rate Route Frequency Ordered Stop   11/17/19 1800  piperacillin-tazobactam (ZOSYN) IVPB 3.375 g     3.375 g 12.5 mL/hr over 240 Minutes Intravenous Every 8 hours 11/17/19 1310     11/17/19 1315  piperacillin-tazobactam (ZOSYN) IVPB 3.375 g     3.375 g 100 mL/hr over 30 Minutes Intravenous  Once 11/17/19 1310 11/17/19 1350       Assessment/Plan Diverticulitis with phlegmon -first episode -will try to get better with bowel rest and conservative measures -will need colonoscopy as outpatient -soft diet -diet education by dietitian -WBC normal today -can likely convert zosyn to oral augmentin -if tolerates diet and oral augmentin can likely DC later today vs tomorrow.  FEN -soft diet VTE -heparin ID -zosyn 4/25 -->   LOS: 3 days    Letha Cape , [facility] 11/20/2019, 9:30 AM Please see Amion for pager number during day hours 7:00am-4:30pm or 7:00am -11:30am on weekends

## 2019-11-20 NOTE — Discharge Instructions (Signed)

## 2019-11-20 NOTE — Discharge Summary (Signed)
Physician Discharge Summary  Ramez Arrona ZOX:[id number] DOB: [birth year] DOA: 11/17/2019  PCP: Patient, No Pcp Per  Admit date: 11/17/2019 Discharge date: 11/20/2019  Admitted From: Home Disposition: Home    Recommendations for Outpatient Follow-up:  1. Follow up with PCP in 1-2 weeks 2. Follow-up with general surgery as needed 3. Continue antibiotics with Augmentin 875-125 mg p.o. twice daily to complete a 14-day course 4. Will need colonoscopy outpatient once recovered from acute diverticulitis  Home Health: No Equipment/Devices: None  Discharge Condition: Stable CODE STATUS: Full code Diet recommendation: Soft diet  History of present illness:  Justin Benjamin is a37 year old male with no significant past medical history presented from med [facility] with severe left lower quadrant abdominal pain that started at 8 PM on 4/24, day before the admission. Patient reported that he went to [employer] for dinner, came home around 7 PM. About an hour later, patient started having significant left lower quadrant pain which progressively got worse, 10 out of 10 in intensity sharp and constant, no radiation. Denied any nausea vomiting or diarrhea. Felt chills but no fevers. Had no prior history of diverticulitis or similar pain in the past.  CT abdomen showed sigmoid diverticulitis, concern for developing abscess maximum dimension 3.5 cm  Hospital course:  Acute sigmoid diverticulitis with developing abscess Presenting with severe left lower quadrant pain.  CT abdomen/pelvis notable for sigmoid diverticulitis with developing abscess 3.5 cm, questionable ileus.  IR was consulted for possible drainage, unable to drain secondary to abscess is within the wall of the colon.  Patient reports this is his first episode of diverticulitis.  General surgery was consulted and followed during hospital course.  Recommended conservative management with bowel rest and IV  antibiotics.  Patient was started on Zosyn with improvement of his white blood cell count from 19.6-8.0 on discharge.  His diet was slowly advanced with good toleration.  We will continue antibiotics outpatient with Augmentin 875-125 mg p.o. twice daily to complete a 14-day course.  Will need to follow-up with PCP and will need colonoscopy outpatient following resolution of his acute infectious process.   Discharge Diagnoses:  Principal Problem:   Diverticulitis of large intestine with abscess    Discharge Instructions  Discharge Instructions    Call MD for:  difficulty breathing, headache or visual disturbances   Complete by: As directed    Call MD for:  extreme fatigue   Complete by: As directed    Call MD for:  persistant dizziness or light-headedness   Complete by: As directed    Call MD for:  persistant nausea and vomiting   Complete by: As directed    Call MD for:  severe uncontrolled pain   Complete by: As directed    Call MD for:  temperature >100.4   Complete by: As directed    Diet - low sodium heart healthy   Complete by: As directed    Increase activity slowly   Complete by: As directed      Allergies as of 11/20/2019   No Known Allergies     Medication List    TAKE these medications   amoxicillin-clavulanate 875-125 MG tablet Commonly known as: Augmentin Take 1 tablet by mouth 2 (two) times daily for 12 days.   bisacodyl 5 MG EC tablet Commonly known as: DULCOLAX Take 5 mg by mouth daily as needed for moderate constipation.   HYDROcodone-acetaminophen 5-325 MG tablet Commonly known as: NORCO/VICODIN Take 1 tablet by mouth every 6 (six) hours  as needed for up to 7 days for moderate pain.       No Known Allergies  Consultations:  General surgery - Dr. Gerrit Friends   Procedures/Studies: CT Abdomen Pelvis W Contrast  Result Date: 11/17/2019 CLINICAL DATA:  Abdominal pain. EXAM: CT ABDOMEN AND PELVIS WITH CONTRAST TECHNIQUE: Multidetector CT imaging of  the abdomen and pelvis was performed using the standard protocol following bolus administration of intravenous contrast. CONTRAST:  OMNIPAQUE IOHEXOL 300 MG/ML  SOLN COMPARISON:  None. FINDINGS: Lower chest: Platelike atelectasis in the lingula. The lower chest and lower lungs are otherwise normal. Hepatobiliary: Hepatic steatosis. The liver, gallbladder, and portal vein are otherwise normal. Pancreas: There is a small probable cyst in the pancreatic tail measuring 8 mm as seen on coronal image 66 and sagittal image 27. The pancreas is otherwise normal. Spleen: Normal in size without focal abnormality. Adrenals/Urinary Tract: Adrenal glands are normal. There is a cyst in the left kidney best seen on coronal image 73. The kidneys, ureters, and bladder are otherwise normal. Stomach/Bowel: The stomach is normal. There are a few prominent/mildly dilated loops of small bowel in the pelvis such as on series 2, image 67. A representative small bowel loop measures just greater than 3 cm in caliber. No discrete transition point is identified. No wall thickening. The remainder of the small bowel is normal. Scattered colonic diverticuli are identified. There is significant stranding adjacent to the sigmoid colon. There are diverticuli in this region. There is low-attenuation in the sigmoid wall in this region as seen on coronal image 44. The low-attenuation appears to contain more than 1 pocket with a total maximum dimension of 3.5 cm. No definite extraluminal gas. No pericolonic abscess identified. The remainder of the colon is unremarkable. Previous appendectomy. Vascular/Lymphatic: No significant vascular findings are present. No enlarged abdominal or pelvic lymph nodes. Reproductive: Prostate is unremarkable. Other: Fat containing right inguinal hernia.  No free air. Musculoskeletal: No acute or significant osseous findings. IMPRESSION: 1. Sigmoid diverticulitis. Several adjacent pockets of low attenuation within the  wall of the sigmoid colon in the region of adjacent stranding is concerning for a developing abscess within the wall of the sigmoid colon. The maximum dimension is 3.5 cm as seen on coronal image 44. No extraluminal gas or pericolonic abscess. 2. A few loops of mildly dilated small bowel are seen in the pelvis. Focal ileus is favored over obstruction. Recommend attention on follow-up and clinical correlation. 3. 8 mm cyst in the pancreatic tail. Recommend a follow-up CT scan in 1 year. 4. Hepatic steatosis. 5. Left renal cysts. 6. Fat containing inguinal hernia. Electronically Signed   By: Gerome Sam III M.D   On: 11/17/2019 12:40      Subjective: Patient seen and examined bedside, resting comfortably.  Abdominal pain much improved, with leukocytosis now resolved.  Tolerating advance diet.  Okay for discharge home by general surgery with continued antibiotics with Augmentin.  No other complaints or concerns at this time.  Denies headache, no fever/chills/night sweats, no nausea without vomiting/diarrhea, no chest pain, palpitations, no shortness of breath, no weakness, no fatigue.  No acute events overnight per nursing staff.  Discharge Exam: Vitals:   11/20/19 1022 11/20/19 1338  BP: 134/88 (!) 131/91  Pulse: 77 79  Resp: 16 16  Temp: 98 F (36.7 C) 98 F (36.7 C)  SpO2: 96% 96%   Vitals:   11/19/19 2121 11/20/19 0532 11/20/19 1022 11/20/19 1338  BP: (!) 144/93 (!) 133/91 134/88 (!) 131/91  Pulse: 80 66 77 79  Resp: 19 20 16 16   Temp: 98.9 F (37.2 C) 97.8 F (36.6 C) 98 F (36.7 C) 98 F (36.7 C)  TempSrc: Oral Oral Oral Oral  SpO2: 94% 95% 96% 96%  Weight:      Height:        General: Pt is alert, awake, not in acute distress Cardiovascular: RRR, S1/S2 +, no rubs, no gallops Respiratory: CTA bilaterally, no wheezing, no rhonchi Abdominal: Soft, NT, ND, bowel sounds + Extremities: no edema, no cyanosis    The results of significant diagnostics from this  hospitalization (including imaging, microbiology, ancillary and laboratory) are listed below for reference.     Microbiology: Recent Results (from the past 240 hour(s))  Respiratory Panel by RT PCR (Flu A&B, Covid) - Nasopharyngeal Swab     Status: None   Collection Time: 11/17/19  1:26 PM   Specimen: Nasopharyngeal Swab  Result Value Ref Range Status   SARS Coronavirus 2 by RT PCR NEGATIVE NEGATIVE Final    Comment: (NOTE) SARS-CoV-2 target nucleic acids are NOT DETECTED. The SARS-CoV-2 RNA is generally detectable in upper respiratoy specimens during the acute phase of infection. The lowest concentration of SARS-CoV-2 viral copies this assay can detect is 131 copies/mL. A negative result does not preclude SARS-Cov-2 infection and should not be used as the sole basis for treatment or other patient management decisions. A negative result may occur with  improper specimen collection/handling, submission of specimen other than nasopharyngeal swab, presence of viral mutation(s) within the areas targeted by this assay, and inadequate number of viral copies (<131 copies/mL). A negative result must be combined with clinical observations, patient history, and epidemiological information. The expected result is Negative. Fact Sheet for Patients:  PinkCheek.be Fact Sheet for Healthcare Providers:  GravelBags.it This test is not yet ap proved or cleared by the [place] FDA and  has been authorized for detection and/or diagnosis of SARS-CoV-2 by FDA under an Emergency Use Authorization (EUA). This EUA will remain  in effect (meaning this test can be used) for the duration of the COVID-19 declaration under Section 564(b)(1) of the Act, 21 U.S.C. section 360bbb-3(b)(1), unless the authorization is terminated or revoked sooner.    Influenza A by PCR NEGATIVE NEGATIVE Final   Influenza B by PCR NEGATIVE NEGATIVE Final    Comment:  (NOTE) The Xpert Xpress SARS-CoV-2/FLU/RSV assay is intended as an aid in  the diagnosis of influenza from Nasopharyngeal swab specimens and  should not be used as a sole basis for treatment. Nasal washings and  aspirates are unacceptable for Xpert Xpress SARS-CoV-2/FLU/RSV  testing. Fact Sheet for Patients: PinkCheek.be Fact Sheet for Healthcare Providers: GravelBags.it This test is not yet approved or cleared by the [place] FDA and  has been authorized for detection and/or diagnosis of SARS-CoV-2 by  FDA under an Emergency Use Authorization (EUA). This EUA will remain  in effect (meaning this test can be used) for the duration of the  Covid-19 declaration under Section 564(b)(1) of the Act, 21  U.S.C. section 360bbb-3(b)(1), unless the authorization is  terminated or revoked. Performed at [facility], [address] [place], [place] [address]   Culture, blood (Routine X 2) w Reflex to ID Panel     Status: None (Preliminary result)   Collection Time: 11/17/19  6:54 PM   Specimen: Right Antecubital; Blood  Result Value Ref Range Status   Specimen Description   Final    RIGHT ANTECUBITAL  Performed at [facility], [address] [place], [place] [address]    Special Requests   Final    BOTTLES DRAWN AEROBIC ONLY Blood Culture adequate volume Performed at [facility], [address] [place], [place] [address]    Culture   Final    NO GROWTH 3 DAYS Performed at [facility], [address] [place], [place] [address]    Report Status PENDING  Incomplete  Culture, blood (Routine X 2) w Reflex to ID Panel     Status: None (Preliminary result)   Collection Time: 11/17/19  6:54 PM   Specimen: BLOOD LEFT HAND  Result Value Ref Range Status   Specimen Description   Final    BLOOD LEFT HAND Performed at [facility], [address]  [place], [place] [address]    Special Requests   Final    BOTTLES DRAWN AEROBIC ONLY Blood Culture adequate volume Performed at [facility], [address] [place], [place] [address]    Culture   Final    NO GROWTH 3 DAYS Performed at [facility], [address] [place], [place] [address]    Report Status PENDING  Incomplete  MRSA PCR Screening     Status: None   Collection Time: 11/18/19  5:00 AM   Specimen: Nasal Mucosa; Nasopharyngeal  Result Value Ref Range Status   MRSA by PCR NEGATIVE NEGATIVE Final    Comment: Performed at [facility], [address] [place], [place] [address]     Labs: BNP (last 3 results) No results for input(s): BNP in the last 8760 hours. Basic Metabolic Panel: Recent Labs  Lab 11/17/19 1110 11/18/19 0430 11/19/19 0435 11/20/19 0413  NA 136 137 133* 137  K 4.2 4.3 3.6 4.2  CL 101 103 102 105  CO2 23 26 22 25   GLUCOSE 113* 110* 94 98  BUN 14 14 10 8   CREATININE 1.21 1.20 0.86 0.96  CALCIUM 10.0 8.8* 8.4* 8.6*  MG  --   --   --  2.3   Liver Function Tests: Recent Labs  Lab 11/17/19 1110  AST 34  ALT 81*  ALKPHOS 60  BILITOT 0.9  PROT 8.1  ALBUMIN 4.7   Recent Labs  Lab 11/17/19 1110  LIPASE 30   No results for input(s): AMMONIA in the last 168 hours. CBC: Recent Labs  Lab 11/17/19 1110 11/18/19 0430 11/19/19 0435 11/20/19 0413  WBC 19.6* 16.7* 13.6* 8.0  NEUTROABS 15.5*  --   --   --   HGB 16.7 14.6 12.7* 12.9*  HCT 48.4 45.6 39.5 38.4*  MCV 91.8 97.6 96.8 96.2  PLT 278 218 194 201   Cardiac Enzymes: No results for input(s): CKTOTAL, CKMB, CKMBINDEX, TROPONINI in the last 168 hours. BNP: Invalid input(s): POCBNP CBG: No results for input(s): GLUCAP in the last 168 hours. D-Dimer No results for input(s): DDIMER in the last 72 hours. Hgb A1c No results for input(s): HGBA1C in the last 72 hours. Lipid Profile No results for input(s): CHOL, HDL, LDLCALC, TRIG, CHOLHDL,  LDLDIRECT in the last 72 hours. Thyroid function studies No results for input(s): TSH, T4TOTAL, T3FREE, THYROIDAB in the last 72 hours.  Invalid input(s): FREET3 Anemia work up No results for input(s): VITAMINB12, FOLATE, FERRITIN, TIBC, IRON, RETICCTPCT in the last 72 hours. Urinalysis No results found for: COLORURINE, APPEARANCEUR, LABSPEC, PHURINE, GLUCOSEU, HGBUR, BILIRUBINUR, KETONESUR, PROTEINUR, UROBILINOGEN, NITRITE, LEUKOCYTESUR Sepsis Labs Invalid  input(s): PROCALCITONIN,  WBC,  LACTICIDVEN Microbiology Recent Results (from the past 240 hour(s))  Respiratory Panel by RT PCR (Flu A&B, Covid) - Nasopharyngeal Swab     Status: None   Collection Time: 11/17/19  1:26 PM   Specimen: Nasopharyngeal Swab  Result Value Ref Range Status   SARS Coronavirus 2 by RT PCR NEGATIVE NEGATIVE Final    Comment: (NOTE) SARS-CoV-2 target nucleic acids are NOT DETECTED. The SARS-CoV-2 RNA is generally detectable in upper respiratoy specimens during the acute phase of infection. The lowest concentration of SARS-CoV-2 viral copies this assay can detect is 131 copies/mL. A negative result does not preclude SARS-Cov-2 infection and should not be used as the sole basis for treatment or other patient management decisions. A negative result may occur with  improper specimen collection/handling, submission of specimen other than nasopharyngeal swab, presence of viral mutation(s) within the areas targeted by this assay, and inadequate number of viral copies (<131 copies/mL). A negative result must be combined with clinical observations, patient history, and epidemiological information. The expected result is Negative. Fact Sheet for Patients:  https://www.moore.com/ Fact Sheet for Healthcare Providers:  https://www.young.biz/ This test is not yet ap proved or cleared by the [place] FDA and  has been authorized for detection and/or diagnosis of SARS-CoV-2  by FDA under an Emergency Use Authorization (EUA). This EUA will remain  in effect (meaning this test can be used) for the duration of the COVID-19 declaration under Section 564(b)(1) of the Act, 21 U.S.C. section 360bbb-3(b)(1), unless the authorization is terminated or revoked sooner.    Influenza A by PCR NEGATIVE NEGATIVE Final   Influenza B by PCR NEGATIVE NEGATIVE Final    Comment: (NOTE) The Xpert Xpress SARS-CoV-2/FLU/RSV assay is intended as an aid in  the diagnosis of influenza from Nasopharyngeal swab specimens and  should not be used as a sole basis for treatment. Nasal washings and  aspirates are unacceptable for Xpert Xpress SARS-CoV-2/FLU/RSV  testing. Fact Sheet for Patients: https://www.moore.com/ Fact Sheet for Healthcare Providers: https://www.young.biz/ This test is not yet approved or cleared by the [place] FDA and  has been authorized for detection and/or diagnosis of SARS-CoV-2 by  FDA under an Emergency Use Authorization (EUA). This EUA will remain  in effect (meaning this test can be used) for the duration of the  Covid-19 declaration under Section 564(b)(1) of the Act, 21  U.S.C. section 360bbb-3(b)(1), unless the authorization is  terminated or revoked. Performed at [facility], [address] [place], [place] [address]   Culture, blood (Routine X 2) w Reflex to ID Panel     Status: None (Preliminary result)   Collection Time: 11/17/19  6:54 PM   Specimen: Right Antecubital; Blood  Result Value Ref Range Status   Specimen Description   Final    RIGHT ANTECUBITAL Performed at [facility], [address], [place], [place] [address]    Special Requests   Final    BOTTLES DRAWN AEROBIC ONLY Blood Culture adequate volume Performed at [facility], [address], [place], [place] [address]    Culture   Final    NO GROWTH 3 DAYS Performed at [facility], [address], [place], [place] [address]    Report Status PENDING  Incomplete  Culture, blood (Routine X 2) w Reflex to ID Panel     Status: None (Preliminary result)   Collection Time: 11/17/19  6:54 PM   Specimen: BLOOD LEFT  HAND  Result Value Ref Range Status   Specimen Description   Final    BLOOD LEFT HAND Performed at [facility], [address], [place], [place] [address]    Special Requests   Final    BOTTLES DRAWN AEROBIC ONLY Blood Culture adequate volume Performed at [facility], [address] [place], [place] [address]    Culture   Final    NO GROWTH 3 DAYS Performed at [facility], [address], [place], [place] [address]    Report Status PENDING  Incomplete  MRSA PCR Screening     Status: None   Collection Time: 11/18/19  5:00 AM   Specimen: Nasal Mucosa; Nasopharyngeal  Result Value Ref Range Status   MRSA by PCR NEGATIVE NEGATIVE Final    Comment: Performed at [facility], [address], [place], [place] [address]     Time coordinating discharge: Over 30 minutes  SIGNED:   Alvira Philips [place], DO  [facility] 11/20/2019, 3:55 PM

## 2019-11-20 NOTE — Progress Notes (Signed)
Discharge instructions given to pt and all questions were answered.  

## 2019-11-22 LAB — CULTURE, BLOOD (ROUTINE X 2)
Culture: NO GROWTH
Culture: NO GROWTH
Special Requests: ADEQUATE
Special Requests: ADEQUATE

## 2021-04-04 IMAGING — CT CT ABD-PELV W/ CM
2 of 5 series · 16 of 46 positions shown, 18 images · IV contrast (omnipaque)
Comparison: None.

CLINICAL DATA: Abdominal pain.

EXAM:
CT ABDOMEN AND PELVIS WITH CONTRAST
TECHNIQUE: Multidetector CT imaging of the abdomen and pelvis was performed
using the standard protocol following bolus administration of
intravenous contrast.
CONTRAST:  100mL OMNIPAQUE IOHEXOL 300 MG/ML  SOLN

[Series 2: axial st · axial · 0.76mm/px · z∈[-473,+7]mm · 13 of 108 slices shown, 15 images]
[im 6/108  soft-tissue]
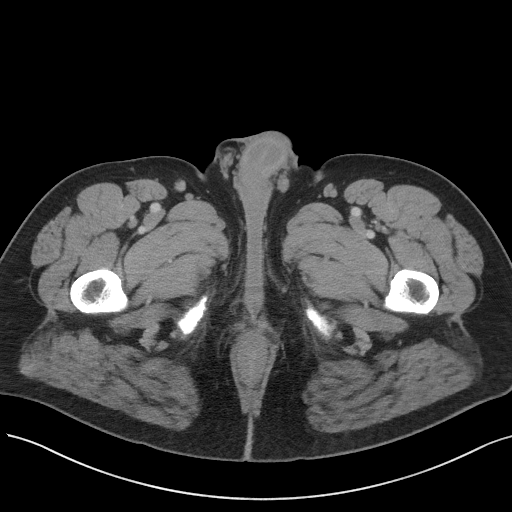
[im 6/108  bone]
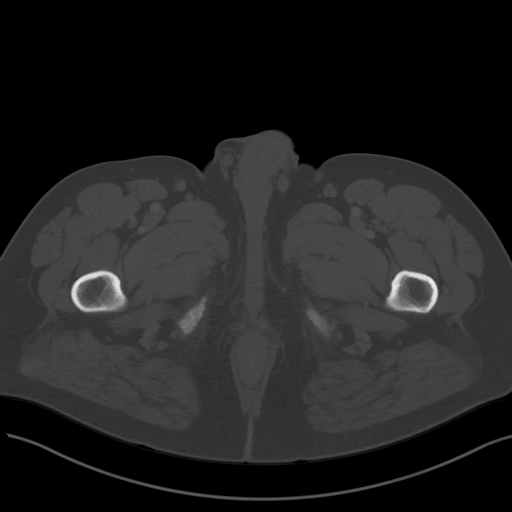
[im 17/108  soft-tissue]
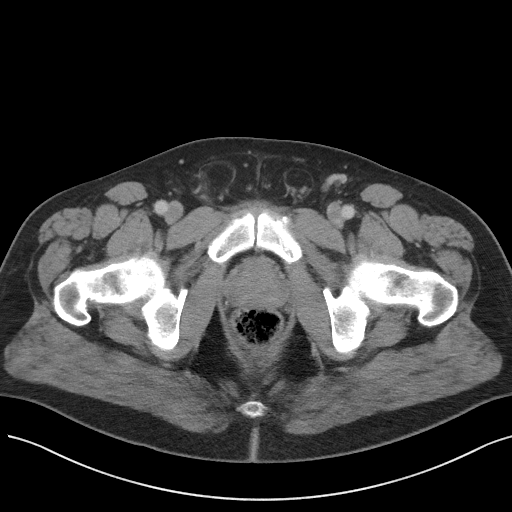
[im 22/108  soft-tissue]
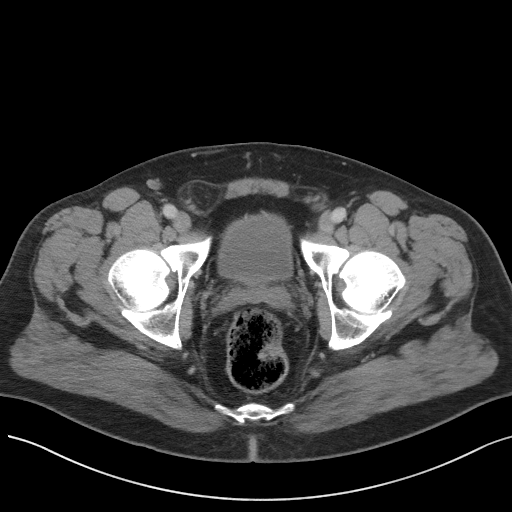
[im 33/108  soft-tissue]
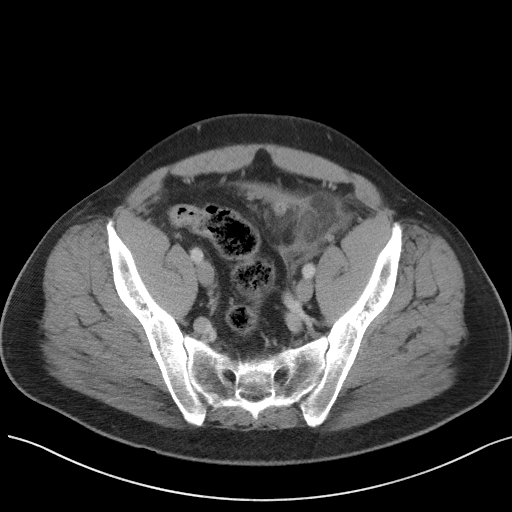
[im 38/108  soft-tissue]
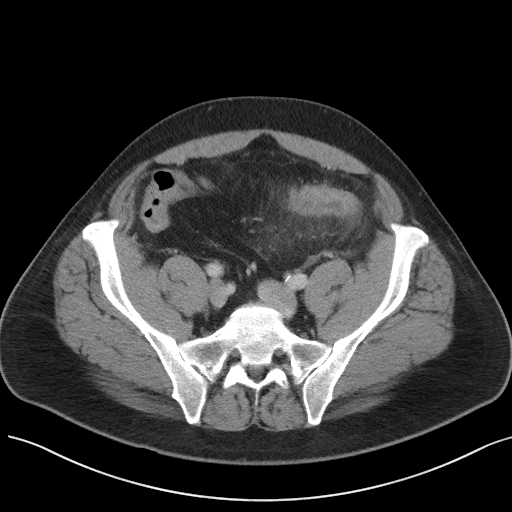
[im 49/108  soft-tissue]
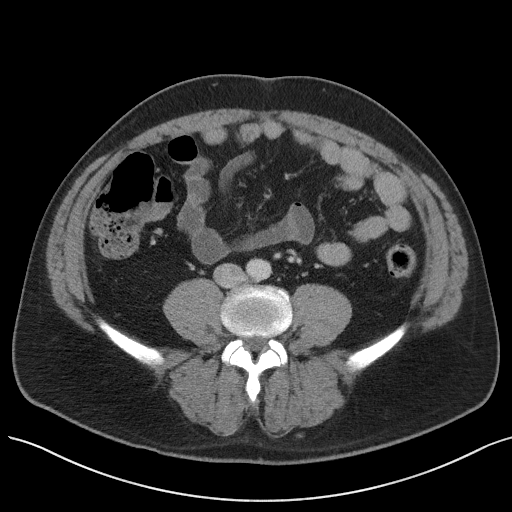
[im 54/108  soft-tissue]
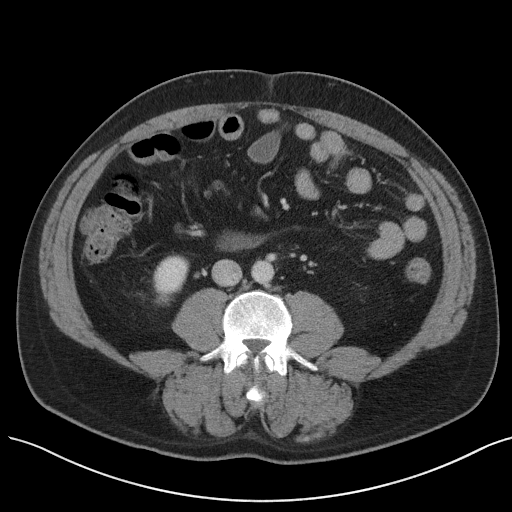
[im 59/108  soft-tissue]
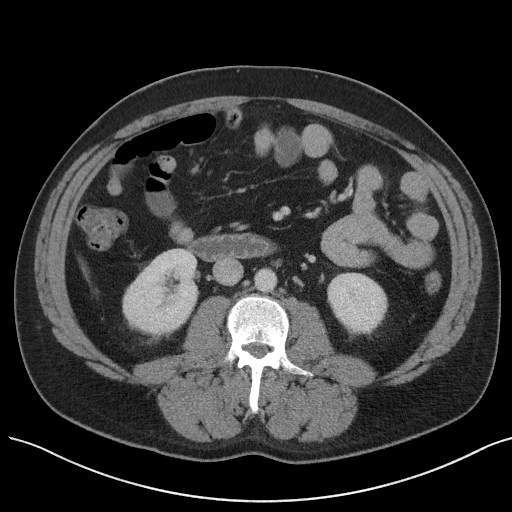
[im 70/108  soft-tissue]
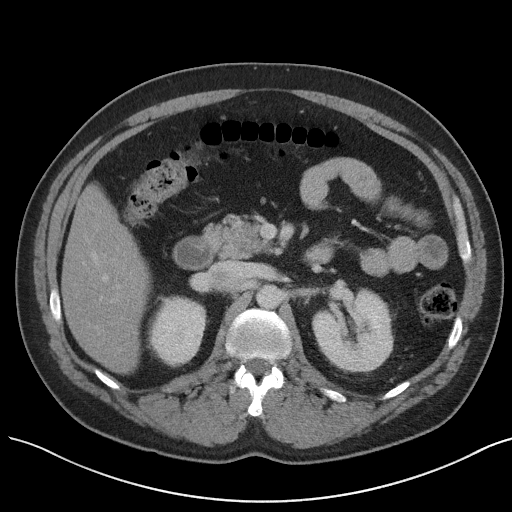
[im 70/108  bone]
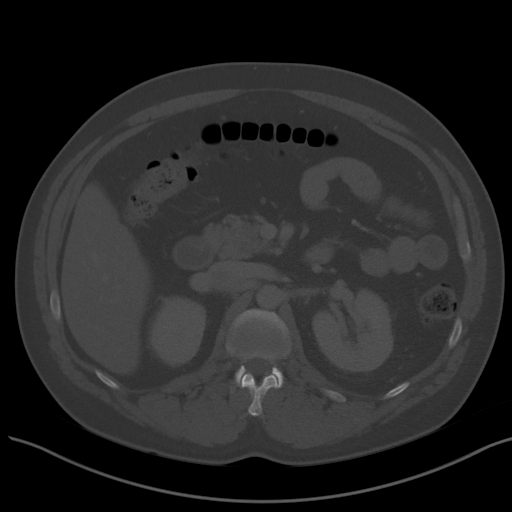
[im 75/108  soft-tissue]
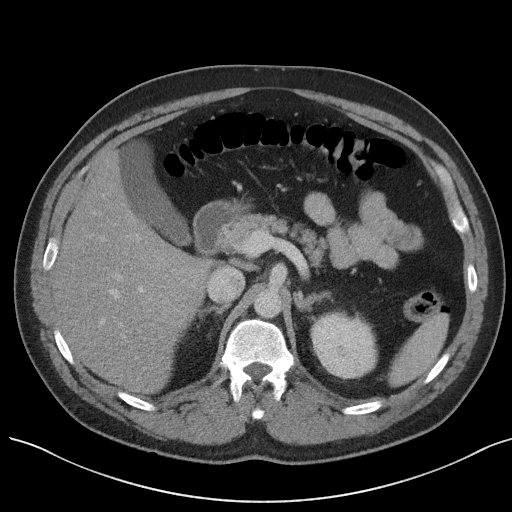
[im 86/108  soft-tissue]
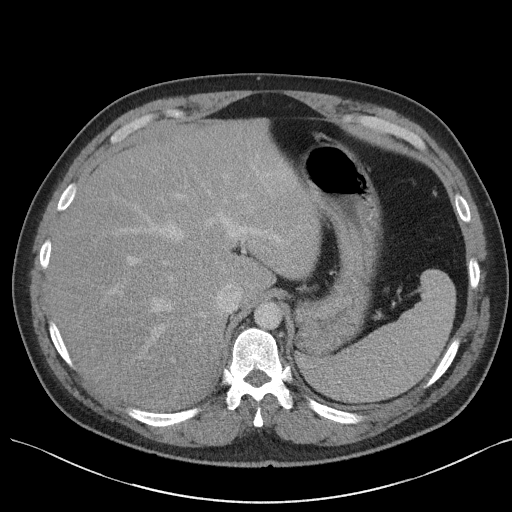
[im 91/108  soft-tissue]
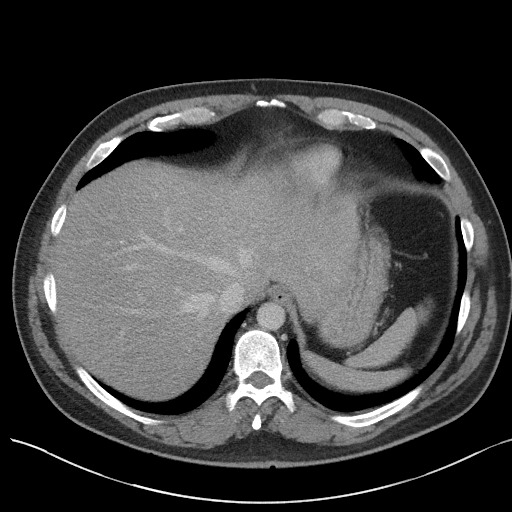
[im 102/108  soft-tissue]
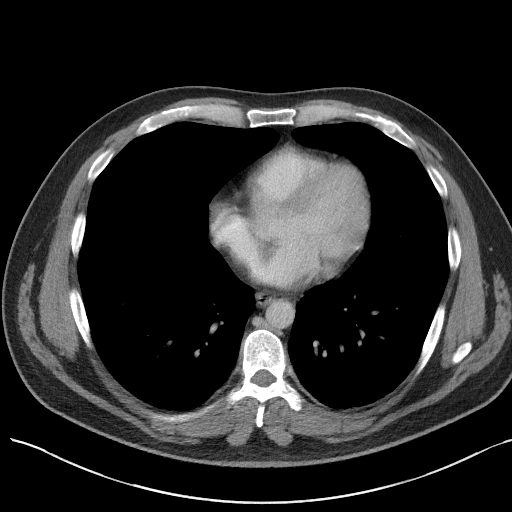

[Series 5: coronal st · coronal · 0.97mm/px · 3 of 110 slices shown]
[im 37/110  soft-tissue]
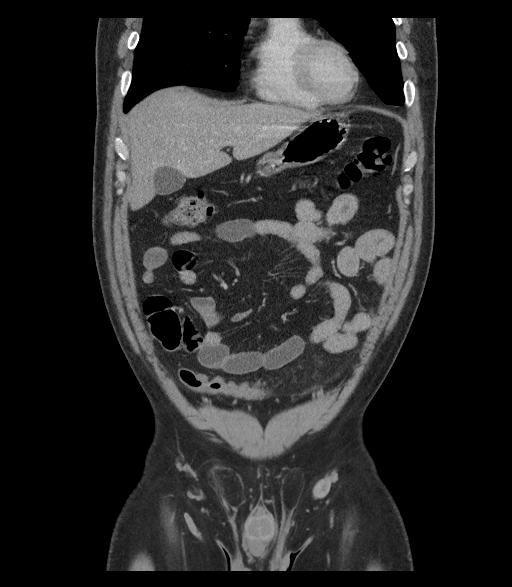
[im 49/110  soft-tissue]
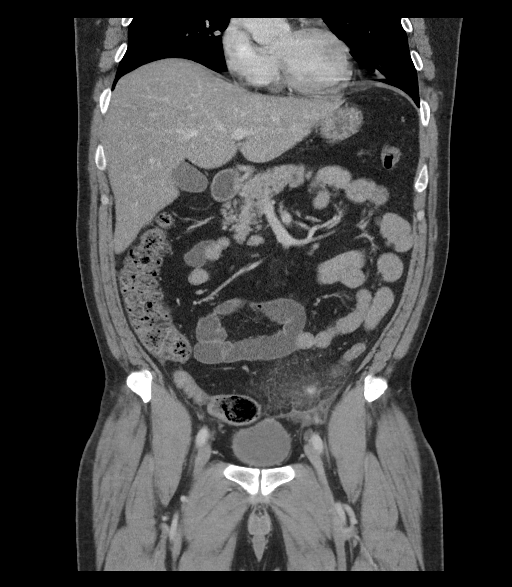
[im 61/110  soft-tissue]
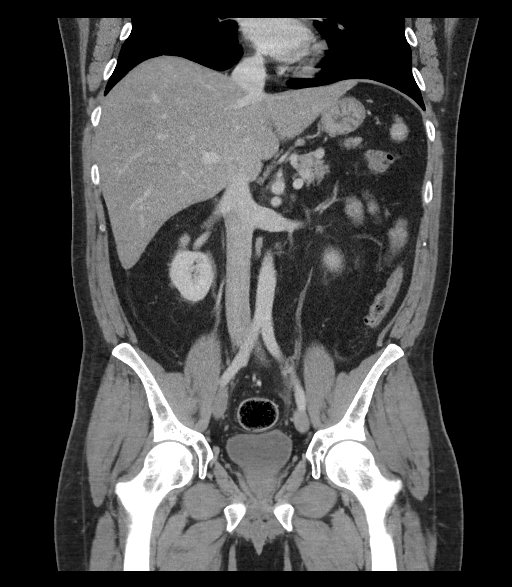

[16 of 46 positions shown; findings below may reference images not displayed]

FINDINGS: Lower chest: Platelike atelectasis in the lingula. The lower chest
and lower lungs are otherwise normal.

Hepatobiliary: Hepatic steatosis. The liver, gallbladder, and portal
vein are otherwise normal.

Pancreas: There is a small probable cyst in the pancreatic tail
measuring 8 mm as seen on coronal image 66 and sagittal image 27.
The pancreas is otherwise normal.

Spleen: Normal in size without focal abnormality.

Adrenals/Urinary Tract: Adrenal glands are normal. There is a cyst
in the left kidney best seen on coronal image 73. The kidneys,
ureters, and bladder are otherwise normal.

Stomach/Bowel: The stomach is normal. There are a few
prominent/mildly dilated loops of small bowel in the pelvis such as
on series 2, image 67. A representative small bowel loop measures
just greater than 3 cm in caliber. No discrete transition point is
identified. No wall thickening. The remainder of the small bowel is
normal. Scattered colonic diverticuli are identified. There is
significant stranding adjacent to the sigmoid colon. There are
diverticuli in this region. There is low-attenuation in the sigmoid
wall in this region as seen on coronal image 44. The low-attenuation
appears to contain more than 1 pocket with a total maximum dimension
of 3.5 cm. No definite extraluminal gas. No pericolonic abscess
identified. The remainder of the colon is unremarkable. Previous
appendectomy.

Vascular/Lymphatic: No significant vascular findings are present. No
enlarged abdominal or pelvic lymph nodes.

Reproductive: Prostate is unremarkable.

Other: Fat containing right inguinal hernia.  No free air.

Musculoskeletal: No acute or significant osseous findings.
IMPRESSION: 1. Sigmoid diverticulitis. Several adjacent pockets of low
attenuation within the wall of the sigmoid colon in the region of
adjacent stranding is concerning for a developing abscess within the
wall of the sigmoid colon. The maximum dimension is 3.5 cm as seen
on coronal image 44. No extraluminal gas or pericolonic abscess.
2. A few loops of mildly dilated small bowel are seen in the pelvis.
Focal ileus is favored over obstruction. Recommend attention on
follow-up and clinical correlation.
3. 8 mm cyst in the pancreatic tail. Recommend a follow-up CT scan
in 1 year.
4. Hepatic steatosis.
5. Left renal cysts.
6. Fat containing inguinal hernia.

## 2022-05-25 DEATH — deceased
